# Patient Record
Sex: Female | Born: 1955 | Race: White | Hispanic: No | Marital: Married | Smoking: Former smoker
Health system: Southern US, Community
[De-identification: ages and names within clinical notes are randomized; demographics above are authoritative.]

## PROBLEM LIST (undated history)

## (undated) DIAGNOSIS — R002 Palpitations: Secondary | ICD-10-CM

## (undated) DIAGNOSIS — R519 Headache, unspecified: Secondary | ICD-10-CM

## (undated) DIAGNOSIS — R87619 Unspecified abnormal cytological findings in specimens from cervix uteri: Secondary | ICD-10-CM

## (undated) DIAGNOSIS — F329 Major depressive disorder, single episode, unspecified: Secondary | ICD-10-CM

## (undated) DIAGNOSIS — K635 Polyp of colon: Secondary | ICD-10-CM

## (undated) DIAGNOSIS — E785 Hyperlipidemia, unspecified: Secondary | ICD-10-CM

## (undated) DIAGNOSIS — K760 Fatty (change of) liver, not elsewhere classified: Secondary | ICD-10-CM

## (undated) DIAGNOSIS — F32A Depression, unspecified: Secondary | ICD-10-CM

## (undated) DIAGNOSIS — R51 Headache: Secondary | ICD-10-CM

## (undated) DIAGNOSIS — I10 Essential (primary) hypertension: Secondary | ICD-10-CM

## (undated) DIAGNOSIS — G43909 Migraine, unspecified, not intractable, without status migrainosus: Secondary | ICD-10-CM

## (undated) DIAGNOSIS — K921 Melena: Secondary | ICD-10-CM

## (undated) HISTORY — DX: Headache: R51

## (undated) HISTORY — DX: Migraine, unspecified, not intractable, without status migrainosus: G43.909

## (undated) HISTORY — DX: Depression, unspecified: F32.A

## (undated) HISTORY — DX: Polyp of colon: K63.5

## (undated) HISTORY — DX: Essential (primary) hypertension: I10

## (undated) HISTORY — PX: LYMPH NODE BIOPSY: SHX201

## (undated) HISTORY — PX: APPENDECTOMY: SHX54

## (undated) HISTORY — DX: Hyperlipidemia, unspecified: E78.5

## (undated) HISTORY — DX: Palpitations: R00.2

## (undated) HISTORY — DX: Unspecified abnormal cytological findings in specimens from cervix uteri: R87.619

## (undated) HISTORY — DX: Headache, unspecified: R51.9

## (undated) HISTORY — PX: TONSILLECTOMY: SUR1361

## (undated) HISTORY — DX: Fatty (change of) liver, not elsewhere classified: K76.0

## (undated) HISTORY — PX: LUMBAR DISC ARTHROPLASTY: SHX699

## (undated) HISTORY — DX: Major depressive disorder, single episode, unspecified: F32.9

## (undated) HISTORY — DX: Melena: K92.1

---

## 2013-06-18 ENCOUNTER — Encounter: Payer: Self-pay | Admitting: Family Medicine

## 2013-06-18 ENCOUNTER — Ambulatory Visit (INDEPENDENT_AMBULATORY_CARE_PROVIDER_SITE_OTHER): Payer: Commercial Managed Care - PPO | Admitting: Family Medicine

## 2013-06-18 VITALS — BP 120/82 | HR 86 | Temp 99.0°F | Ht 64.25 in | Wt 156.0 lb

## 2013-06-18 DIAGNOSIS — I10 Essential (primary) hypertension: Secondary | ICD-10-CM

## 2013-06-18 DIAGNOSIS — G43909 Migraine, unspecified, not intractable, without status migrainosus: Secondary | ICD-10-CM

## 2013-06-18 DIAGNOSIS — F3289 Other specified depressive episodes: Secondary | ICD-10-CM

## 2013-06-18 DIAGNOSIS — R7309 Other abnormal glucose: Secondary | ICD-10-CM

## 2013-06-18 DIAGNOSIS — F32A Depression, unspecified: Secondary | ICD-10-CM | POA: Insufficient documentation

## 2013-06-18 DIAGNOSIS — F329 Major depressive disorder, single episode, unspecified: Secondary | ICD-10-CM

## 2013-06-18 DIAGNOSIS — E785 Hyperlipidemia, unspecified: Secondary | ICD-10-CM | POA: Insufficient documentation

## 2013-06-18 DIAGNOSIS — R7303 Prediabetes: Secondary | ICD-10-CM

## 2013-06-18 DIAGNOSIS — R002 Palpitations: Secondary | ICD-10-CM

## 2013-06-18 DIAGNOSIS — G8929 Other chronic pain: Secondary | ICD-10-CM

## 2013-06-18 DIAGNOSIS — M542 Cervicalgia: Secondary | ICD-10-CM

## 2013-06-18 HISTORY — DX: Palpitations: R00.2

## 2013-06-18 LAB — LIPID PANEL
CHOL/HDL RATIO: 4
Cholesterol: 209 mg/dL — ABNORMAL HIGH (ref 0–200)
HDL: 56 mg/dL (ref >39.00)
LDL CALC: 112 mg/dL — AB (ref 0–99)
NonHDL: 153
Triglycerides: 203 mg/dL — ABNORMAL HIGH (ref 0.0–149.0)
VLDL: 40.6 mg/dL — AB (ref 0.0–40.0)

## 2013-06-18 LAB — HEMOGLOBIN A1C: Hgb A1c MFr Bld: 5.6 % (ref 4.6–6.5)

## 2013-06-18 MED ORDER — SUMATRIPTAN SUCCINATE 50 MG PO TABS: 50.0000 mg | ORAL_TABLET | ORAL | Status: DC | PRN

## 2013-06-18 MED ORDER — METOPROLOL SUCCINATE ER 50 MG PO TB24: 50.0000 mg | ORAL_TABLET | Freq: Every day | ORAL | Status: DC

## 2013-06-18 MED ORDER — VENLAFAXINE HCL 37.5 MG PO TABS: 37.5000 mg | ORAL_TABLET | Freq: Every day | ORAL | Status: DC

## 2013-06-18 NOTE — Progress Notes (Signed)
No chief complaint on file.   HPI:  Emily ReamsDeborah Fowler is here to establish care. Recently moved here. Last PCP and physical: has been 1.5 years since last physical - will see gyn.   Has the following chronic problems and concerns today:  Patient Active Problem List   Diagnosis Date Noted  . Depression 06/18/2013  . Palpitations - evaluated by cardiologist in 2015 06/18/2013  . Hypertension 06/18/2013  . Dyslipidemia 06/18/2013  . Prediabetes 06/18/2013  . Chronic neck pain - DDD 06/18/2013  . Migraines 06/18/2013   ? Borderline diabetes: -has been about two years since check -denies: polyuria, polydipsia or vision changes  ? Borderline HLD: -in the past, never on medication -hx of NASH - saw GI in the past for this and no medication -diet is good, walking 3 times per week  HTN/Palpitations: -on metoprolol for a long time -reports on BB because of palpitations too - evaluated by cardiologist in 2015 -denies: CP, SOB, palpitation, swelling  Depression: -on effexor chronically  -stable -no recent depression or thoughts of self harm  DDD: -saw her neurosurgeon recently and they reports MRI was ok and advised PT  Migraines: -uses 1/2 or small amount of imitrex a few times per month and aware of risks  ROS: See pertinent positives and negatives per HPI.  Past Medical History  Diagnosis Date  . Hypertension   . Hyperlipidemia   . Blood in stool     evaluated with GI in 2014 with EGD and colonoscopy and had hemorroids, told repeat colonoscoy in 10 years  . Frequent headaches   . Migraines   . Colon polyps   . Palpitations - evaluated by cardiologist in 2015 06/18/2013   Past Surgical History  Procedure Laterality Date  . Lumbar disc arthroplasty    . Appendectomy    . Tonsillectomy    . Lymph node biopsy      in 2015, evaluated by ENT for abnormal lymph node in neck     Family History  Problem Relation Age of Onset  . Heart disease Father   . Hyperlipidemia  Father   . Hypertension Father   . Diabetes Mother   . Hyperlipidemia Mother   . Hypertension Mother   . Mental illness Brother   . Cancer Maternal Aunt     lymphoma  . Cancer Maternal Grandfather     prostate  . Diabetes Paternal Grandmother     History   Social History  . Marital Status: Divorced    Spouse Name: N/A    Number of Children: N/A  . Years of Education: N/A   Social History Main Topics  . Smoking status: Former Games developermoker  . Smokeless tobacco: None     Comment: 1-2 cigs for 10 years, quit in 2014  . Alcohol Use: Yes     Comment: a few drinks per week  . Drug Use: None  . Sexual Activity: None   Other Topics Concern  . 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, Museum/gallery curatorchemistry and engineering      Spiritual Beliefs: spiritual person, does not attend church, raised catholic      Lifestyle: walking 3 days per week; diet is healthy             Current outpatient prescriptions:metoprolol succinate (TOPROL-XL) 50 MG 24 hr tablet,  Take 1 tablet (50 mg total) by mouth daily., Disp: 90 tablet, Rfl: 3;  Multiple Vitamins-Minerals (MULTIVITAMIN PO), Take by mouth., Disp: , Rfl: ;  NON FORMULARY, Tumeric, Disp: , Rfl: ;  PRESCRIPTION MEDICATION, Minivey 1mg /0.5-28 day, Disp: , Rfl: ;  Probiotic Product (ALIGN PO), Take by mouth daily., Disp: , Rfl:  SUMAtriptan (IMITREX) 50 MG tablet, Take 1 tablet (50 mg total) by mouth every 2 (two) hours as needed for migraine or headache. May repeat in 2 hours if headache persists or recurs., Disp: 10 tablet, Rfl: 3;  venlafaxine (EFFEXOR) 37.5 MG tablet, Take 1 tablet (37.5 mg total) by mouth daily., Disp: 90 tablet, Rfl: 3  EXAM:  Filed Vitals:   06/18/13 1121  BP: 120/82  Pulse: 86  Temp: 99 F (37.2 C)    Body mass index is 26.57 kg/(m^2).  GENERAL: vitals reviewed and listed above, alert,  oriented, appears well hydrated and in no acute distress  HEENT: atraumatic, conjunttiva clear, no obvious abnormalities on inspection of external nose and ears  NECK: no obvious masses on inspection  LUNGS: clear to auscultation bilaterally, no wheezes, rales or rhonchi, good air movement  CV: HRRR, no peripheral edema  MS: moves all extremities without noticeable abnormality  PSYCH: pleasant and cooperative, no obvious depression or anxiety  ASSESSMENT AND PLAN:  Discussed the following assessment and plan:  Depression - Plan: venlafaxine (EFFEXOR) 37.5 MG tablet  Palpitations - Plan: metoprolol succinate (TOPROL-XL) 50 MG 24 hr tablet  Hypertension - Plan: metoprolol succinate (TOPROL-XL) 50 MG 24 hr tablet  Dyslipidemia - Plan: Lipid panel  Prediabetes - Plan: Hemoglobin A1c  Migraines - Plan: SUMAtriptan (IMITREX) 50 MG tablet  Chronic neck pain - DDD  -We reviewed the PMH, PSH, FH, SH, Meds and Allergies. -We provided refills for any medications we will prescribe as needed. -We addressed current concerns per orders and patient instructions. -We have asked for records for pertinent exams, studies, vaccines and notes from previous providers. -We have advised patient to follow up per instructions below.   -Patient advised to return or notify a doctor immediately if symptoms worsen or persist or new concerns arise.  Patient Instructions  -We have ordered labs or studies at this visit. It can take up to 1-2 weeks for results and processing. We will contact you with instructions IF your results are abnormal. Normal results will be released to your [facility]. If you have not heard from us or can not find your results in [facility] in 2 weeks please contact our office.  -PLEASE SIGN UP FOR MYCHART TODAY   We recommend the following healthy lifestyle measures: - eat a healthy diet consisting of lots of vegetables, fruits, beans, nuts, seeds, healthy meats such as white chicken  and fish and whole grains.  - avoid fried foods, fast food, processed foods, sodas, red meet and other fattening foods.  - get a least 150 minutes of aerobic exercise per week.   -let us know if you want to do the physical therapy for you neck and we can place preferral  -call to schedule your appointment with a gynecologist - number in brochure provied  Follow up in: 4-6 months      Durward Matranga R.

## 2013-06-18 NOTE — Progress Notes (Signed)
Pre visit review using our clinic review tool, if applicable. No additional management support is needed unless otherwise documented below in the visit note. 

## 2013-06-18 NOTE — Patient Instructions (Signed)
-  We have ordered labs or studies at this visit. It can take up to 1-2 weeks for results and processing. We will contact you with instructions IF your results are abnormal. Normal results will be released to your [facility]. If you have not heard from us or can not find your results in [facility] in 2 weeks please contact our office.  -PLEASE SIGN UP FOR MYCHART TODAY   We recommend the following healthy lifestyle measures: - eat a healthy diet consisting of lots of vegetables, fruits, beans, nuts, seeds, healthy meats such as white chicken and fish and whole grains.  - avoid fried foods, fast food, processed foods, sodas, red meet and other fattening foods.  - get a least 150 minutes of aerobic exercise per week.   -let us know if you want to do the physical therapy for you neck and we can place preferral  -call to schedule your appointment with a gynecologist - number in brochure provied  Follow up in: 4-6 months

## 2013-06-19 ENCOUNTER — Telehealth: Payer: Self-pay | Admitting: Family Medicine

## 2013-06-19 NOTE — Telephone Encounter (Signed)
Relevant patient education assigned to patient using Emmi. ° °

## 2013-07-21 ENCOUNTER — Telehealth: Payer: Self-pay | Admitting: Family Medicine

## 2013-07-21 DIAGNOSIS — M545 Low back pain, unspecified: Secondary | ICD-10-CM

## 2013-07-21 NOTE — Telephone Encounter (Signed)
I called the pt and left a detailed message on the pts cell number the order was placed for the referral and someone from our office will call her with the appt.

## 2013-07-21 NOTE — Telephone Encounter (Signed)
Pt was to call back w/ the name of the PT person she wanted to see for her back issue. It is  Garment[facility]textile [facility]Brad [facility]

## 2013-07-23 ENCOUNTER — Ambulatory Visit (INDEPENDENT_AMBULATORY_CARE_PROVIDER_SITE_OTHER): Payer: Commercial Managed Care - PPO | Admitting: Family Medicine

## 2013-07-23 ENCOUNTER — Encounter: Payer: Self-pay | Admitting: Family Medicine

## 2013-07-23 VITALS — BP 108/70 | HR 80 | Temp 98.7°F | Ht 64.25 in

## 2013-07-23 DIAGNOSIS — M5136 Other intervertebral disc degeneration, lumbar region: Secondary | ICD-10-CM | POA: Insufficient documentation

## 2013-07-23 DIAGNOSIS — M545 Low back pain, unspecified: Secondary | ICD-10-CM

## 2013-07-23 DIAGNOSIS — M542 Cervicalgia: Secondary | ICD-10-CM

## 2013-07-23 DIAGNOSIS — G8929 Other chronic pain: Secondary | ICD-10-CM

## 2013-07-23 DIAGNOSIS — M5137 Other intervertebral disc degeneration, lumbosacral region: Secondary | ICD-10-CM

## 2013-07-23 MED ORDER — CYCLOBENZAPRINE HCL 10 MG PO TABS: 10.0000 mg | ORAL_TABLET | Freq: Three times a day (TID) | ORAL | Status: DC | PRN

## 2013-07-23 NOTE — Progress Notes (Signed)
No chief complaint on file.   HPI:  Back Pain: -hx ddd and siatica with hx remote discectomy, has had injections, [facility] -flares from time to time and had flare in last 4 days, now improving, after driving a lot -pain is intermittent, severe with certain twisting and bending movement, better with lying down -has PT set up -denies: fevers, malaise, weakness, numbness, bowel or bladder incontinence - sometimes feels like L leg will give out  MRI from 2012 and spine center notes scanned in.  ROS: See pertinent positives and negatives per HPI.  Past Medical History  Diagnosis Date  . Hypertension   . Hyperlipidemia   . Blood in stool     evaluated with GI in 2014 with EGD and colonoscopy and had hemorroids, told repeat colonoscoy in 10 years  . Frequent headaches   . Migraines   . Colon polyps   . Palpitations - evaluated by cardiologist in 2015 06/18/2013  . Abnormal Pap smear of cervix     Past Surgical History  Procedure Laterality Date  . Lumbar disc arthroplasty    . Appendectomy    . Tonsillectomy    . Lymph node biopsy      in 2015, evaluated by ENT for abnormal lymph node in neck    Family History  Problem Relation Age of Onset  . Heart disease Father   . Hyperlipidemia Father   . Hypertension Father   . Diabetes Mother   . Hyperlipidemia Mother   . Hypertension Mother   . Mental illness Brother   . Cancer Maternal Aunt     lymphoma  . Cancer Maternal Grandfather     prostate  . Diabetes Paternal Grandmother     History   Social History  . Marital Status: Divorced    Spouse Name: N/A    Number of Children: N/A  . Years of Education: N/A   Social History Main Topics  . Smoking status: Former Games developermoker  . Smokeless tobacco: None     Comment: 1-2 cigs for 10 years, quit in 2014  . Alcohol Use: Yes     Comment: a few drinks per week  . Drug Use: None  . Sexual Activity: None   Other Topics Concern  . 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, Museum/gallery curatorchemistry and engineering      Spiritual Beliefs: spiritual person, does not attend church, raised catholic      Lifestyle: walking 3 days per week; diet is healthy             Current outpatient prescriptions:metoprolol succinate (TOPROL-XL) 50 MG 24 hr tablet, Take 1 tablet (50 mg total) by mouth daily., Disp: 90 tablet, Rfl: 3;  Multiple Vitamins-Minerals (MULTIVITAMIN PO), Take by mouth., Disp: , Rfl: ;  NON FORMULARY, Tumeric, Disp: , Rfl: ;  PRESCRIPTION MEDICATION, Minivey 1mg /0.5-28 day, Disp: , Rfl: ;  Probiotic Product (ALIGN PO), Take by mouth daily., Disp: , Rfl:  SUMAtriptan (IMITREX) 50 MG tablet, Take 1 tablet (50 mg total) by mouth every 2 (two) hours as needed for migraine or headache. May repeat in 2 hours if headache persists or recurs., Disp: 10 tablet, Rfl: 3;  venlafaxine (EFFEXOR) 37.5 MG tablet, Take 1 tablet (37.5 mg total) by mouth daily., Disp: 90 tablet, Rfl: 3 cyclobenzaprine (FLEXERIL)  10 MG tablet, Take 1 tablet (10 mg total) by mouth 3 (three) times daily as needed for muscle spasms., Disp: 15 tablet, Rfl: 0  EXAM:  Filed Vitals:   07/23/13 1018  BP: 108/70  Pulse: 80  Temp: 98.7 F (37.1 C)    Body mass index is 0.00 kg/(m^2).  GENERAL: vitals reviewed and listed above, alert, oriented, appears well hydrated and in no acute distress  HEENT: atraumatic, conjunttiva clear, no obvious abnormalities on inspection of external nose and ears  NECK: no obvious masses on inspection  Normal Gait Normal inspection of back, no obvious scoliosis or leg length descrepancy No bony TTP Soft tissue TTP at: very low lumbar paraspinal soft tissues bilat and L PSIS/SI jt region -/+ tests: neg trendelenburg,+facet loading L, -SLRT, -CLRT, +FABER bilat, +FADIR + Normal muscle strength, sensation to light  touch and DTRs in LEs bilaterally  MS: moves all extremities without noticeable abnormality  PSYCH: pleasant and cooperative, no obvious depression or anxiety  ASSESSMENT AND PLAN:  Discussed the following assessment and plan:  Chronic neck pain - DDD  DDD (degenerative disc disease), lumbar - Plan: cyclobenzaprine (FLEXERIL) 10 MG tablet  Left low back pain, with sciatica presence unspecified - Plan: cyclobenzaprine (FLEXERIL) 10 MG tablet  DDD (degenerative disc disease), lumbar, s/p surgery remotely - followed by [facility]  -we discussed possible serious and likely etiologies, workup and treatment, treatment risks and return precautions with SI jy pathology versus DDD with mild radicular symptoms most likely, no alarm features or findings on exam -after this discussion, Preciosa opted for follow up with her spine center as steroid injs helped emensly in the past and she does not like taking oral steroids.discussed options for pain and she prefers nsaids or tylenol and muscle relaxer after discussion risks. HEP -follow up advised with spine cent and PT and here as needed -of course, we advised Abbygail  to return or notify a doctor immediately if symptoms worsen or persist or new concerns arise. -spine center results scanned in  -Patient advised to return or notify a doctor immediately if symptoms worsen or persist or new concerns arise.  Patient Instructions  Call for appointment with your back specialist: [phone]  Do the exercises provided as tolerated and keep the physical therapy appointment  Tylenol 500-1000mg  up to 3 times dialy or aleve up to 2 tablets twice dialy  Muscle relaxer as needed per instructions  Heat 15 minutes twice dialy  Follow up with me as needed      Kriste Basque R.

## 2013-07-23 NOTE — Progress Notes (Signed)
Pre visit review using our clinic review tool, if applicable. No additional management support is needed unless otherwise documented below in the visit note. 

## 2013-07-23 NOTE — Patient Instructions (Signed)
Call for appointment with your back specialist: [phone]  Do the exercises provided as tolerated and keep the physical therapy appointment  Tylenol 500-1000mg  up to 3 times dialy or aleve up to 2 tablets twice dialy  Muscle relaxer as needed per instructions  Heat 15 minutes twice dialy  Follow up with me as needed

## 2013-08-23 ENCOUNTER — Other Ambulatory Visit: Payer: Self-pay | Admitting: Family Medicine

## 2013-08-23 MED ORDER — VENLAFAXINE HCL 37.5 MG PO TABS: 37.5000 mg | ORAL_TABLET | Freq: Every day | ORAL | Status: DC

## 2013-08-23 NOTE — Addendum Note (Signed)
Addended by: Johnella MoloneyFUNDERBURK, JO A on: 08/23/2013 05:31 PM   Modules accepted: Orders

## 2013-08-23 NOTE — Telephone Encounter (Signed)
Rx done. 

## 2013-10-03 ENCOUNTER — Other Ambulatory Visit: Payer: Self-pay | Admitting: Family Medicine

## 2013-10-25 ENCOUNTER — Encounter: Payer: Self-pay | Admitting: Family Medicine

## 2013-10-25 ENCOUNTER — Ambulatory Visit (INDEPENDENT_AMBULATORY_CARE_PROVIDER_SITE_OTHER): Payer: Commercial Managed Care - PPO | Admitting: Family Medicine

## 2013-10-25 VITALS — BP 100/80 | HR 96 | Temp 98.8°F | Ht 64.25 in | Wt 160.0 lb

## 2013-10-25 DIAGNOSIS — J069 Acute upper respiratory infection, unspecified: Secondary | ICD-10-CM

## 2013-10-25 NOTE — Progress Notes (Signed)
No chief complaint on file.   HPI:  -started: about 8 days ago  -symptoms:nasal congestion, sore throat, cough, fatigue, PND, cough, did feel a little achey, very mild SOB -denies:fever, SOB, NVD, tooth pain -has tried:  Robitussin, cough medication, musinex -sick contacts/travel/risks: denies flu exposure, tick exposure or or Ebola risks -she is going to get flu shot tomorrow  ROS: See pertinent positives and negatives per HPI.  Past Medical History  Diagnosis Date  . Hypertension   . Hyperlipidemia   . Blood in stool     evaluated with GI in 2014 with EGD and colonoscopy and had hemorroids, told repeat colonoscoy in 10 years  . Frequent headaches   . Migraines   . Colon polyps   . Palpitations - evaluated by cardiologist in 2015 06/18/2013  . Abnormal Pap smear of cervix     Past Surgical History  Procedure Laterality Date  . Lumbar disc arthroplasty    . Appendectomy    . Tonsillectomy    . Lymph node biopsy      in 2015, evaluated by ENT for abnormal lymph node in neck    Family History  Problem Relation Age of Onset  . Heart disease Father   . Hyperlipidemia Father   . Hypertension Father   . Diabetes Mother   . Hyperlipidemia Mother   . Hypertension Mother   . Mental illness Brother   . Cancer Maternal Aunt     lymphoma  . Cancer Maternal Grandfather     prostate  . Diabetes Paternal Grandmother     History   Social History  . Marital Status: Divorced    Spouse Name: N/A    Number of Children: N/A  . Years of Education: N/A   Social History Main Topics  . Smoking status: Former Games developermoker  . Smokeless tobacco: None     Comment: 1-2 cigs for 10 years, quit in 2014  . Alcohol Use: Yes     Comment: a few drinks per week  . Drug Use: None  . Sexual Activity: None   Other Topics Concern  . None   Social History Narrative   Work or School: [employer] - parttime      Home Situation: living with partner - female partner; three great kids -  all out of house law, Museum/gallery curatorchemistry and engineering      Spiritual Beliefs: spiritual person, does not attend church, raised catholic      Lifestyle: walking 3 days per week; diet is healthy             Current outpatient prescriptions:metoprolol succinate (TOPROL-XL) 50 MG 24 hr tablet, Take 1 tablet (50 mg total) by mouth daily., Disp: 90 tablet, Rfl: 3;  Multiple Vitamins-Minerals (MULTIVITAMIN PO), Take by mouth., Disp: , Rfl: ;  NON FORMULARY, Tumeric, Disp: , Rfl: ;  PRESCRIPTION MEDICATION, Minivey 1mg /0.5-28 day, Disp: , Rfl: ;  Probiotic Product (ALIGN PO), Take by mouth daily., Disp: , Rfl:  SUMAtriptan (IMITREX) 100 MG tablet, TAKE 1 TABLET BY MOUTH EVERY DAY AS NEEDED FOR MIGRAINE HEADACHE, Disp: 20 tablet, Rfl: 3;  venlafaxine (EFFEXOR) 37.5 MG tablet, Take 1 tablet (37.5 mg total) by mouth daily., Disp: 90 tablet, Rfl: 1  EXAM:  Filed Vitals:   10/25/13 1107  BP: 100/80  Pulse: 96  Temp: 98.8 F (37.1 C)    Body mass index is 27.25 kg/(m^2).  GENERAL: vitals reviewed and listed above, alert, oriented, appears well hydrated and in no acute distress  HEENT: atraumatic, conjunttiva clear, no obvious abnormalities on inspection of external nose and ears, normal appearance of ear canals and TMs, clear nasal congestion, mild post oropharyngeal erythema with PND, no tonsillar edema or exudate, no sinus TTP  NECK: no obvious masses on inspection  LUNGS: clear to auscultation bilaterally, no wheezes, rales or rhonchi, good air movement  CV: HRRR, no peripheral edema  MS: moves all extremities without noticeable abnormality  PSYCH: pleasant and cooperative, no obvious depression or anxiety  ASSESSMENT AND PLAN:  Discussed the following assessment and plan:  Acute upper respiratory infection  -given HPI and exam findings today, a serious infection or illness is unlikely. We discussed potential etiologies, with VURI being most likely, and advised supportive care and  monitoring. We discussed treatment side effects, likely course, antibiotic misuse, transmission, and signs of developing a serious illness. -of course, we advised to return or notify a doctor immediately if symptoms worsen or persist or new concerns arise.    Patient Instructions  INSTRUCTIONS FOR UPPER RESPIRATORY INFECTION:  -plenty of rest and fluids  -nasal saline wash 2-3 times daily (use prepackaged nasal saline or bottled/distilled water if making your own)   -can use sinex nasal spray for drainage and nasal congestion - but do NOT use longer then 3-4 days  -can use tylenol or ibuprofen as directed for aches and sorethroat  -in the winter time, using a humidifier at night is helpful (please follow cleaning instructions)  -if you are taking a cough medication - use only as directed, may also try a teaspoon of honey to coat the throat and throat lozenges  -for sore throat, salt water gargles can help  -follow up if you have fevers, facial pain, tooth pain, difficulty breathing or are worsening or not getting better in 5-7 days      KIM, HANNAH R.

## 2013-10-25 NOTE — Patient Instructions (Signed)

## 2013-10-25 NOTE — Progress Notes (Signed)
Pre visit review using our clinic review tool, if applicable. No additional management support is needed unless otherwise documented below in the visit note. 

## 2013-11-08 ENCOUNTER — Encounter: Payer: Self-pay | Admitting: Family Medicine

## 2014-01-10 ENCOUNTER — Ambulatory Visit (INDEPENDENT_AMBULATORY_CARE_PROVIDER_SITE_OTHER): Payer: Commercial Managed Care - PPO | Admitting: Family Medicine

## 2014-01-10 ENCOUNTER — Encounter: Payer: Self-pay | Admitting: Family Medicine

## 2014-01-10 VITALS — BP 128/90 | HR 88 | Temp 98.5°F | Ht 63.75 in | Wt 159.0 lb

## 2014-01-10 DIAGNOSIS — I159 Secondary hypertension, unspecified: Secondary | ICD-10-CM

## 2014-01-10 DIAGNOSIS — R7309 Other abnormal glucose: Secondary | ICD-10-CM

## 2014-01-10 DIAGNOSIS — F329 Major depressive disorder, single episode, unspecified: Secondary | ICD-10-CM

## 2014-01-10 DIAGNOSIS — H6992 Unspecified Eustachian tube disorder, left ear: Secondary | ICD-10-CM

## 2014-01-10 DIAGNOSIS — R7303 Prediabetes: Secondary | ICD-10-CM

## 2014-01-10 DIAGNOSIS — R42 Dizziness and giddiness: Secondary | ICD-10-CM

## 2014-01-10 DIAGNOSIS — F32A Depression, unspecified: Secondary | ICD-10-CM

## 2014-01-10 LAB — CBC WITH DIFFERENTIAL/PLATELET
Basophils Absolute: 0 10*3/uL (ref 0.0–0.1)
Basophils Relative: 0.4 % (ref 0.0–3.0)
Eosinophils Absolute: 0.2 10*3/uL (ref 0.0–0.7)
Eosinophils Relative: 3 % (ref 0.0–5.0)
HCT: 45.5 % (ref 36.0–46.0)
Hemoglobin: 15.1 g/dL — ABNORMAL HIGH (ref 12.0–15.0)
LYMPHS ABS: 2.3 10*3/uL (ref 0.7–4.0)
Lymphocytes Relative: 29.6 % (ref 12.0–46.0)
MCHC: 33.2 g/dL (ref 30.0–36.0)
MCV: 91.1 fl (ref 78.0–100.0)
MONOS PCT: 13.6 % — AB (ref 3.0–12.0)
Monocytes Absolute: 1 10*3/uL (ref 0.1–1.0)
NEUTROS ABS: 4.1 10*3/uL (ref 1.4–7.7)
Neutrophils Relative %: 53.4 % (ref 43.0–77.0)
Platelets: 257 10*3/uL (ref 150.0–400.0)
RBC: 4.99 Mil/uL (ref 3.87–5.11)
RDW: 12.9 % (ref 11.5–15.5)
WBC: 7.7 10*3/uL (ref 4.0–10.5)

## 2014-01-10 LAB — BASIC METABOLIC PANEL
BUN: 15 mg/dL (ref 6–23)
CHLORIDE: 105 meq/L (ref 96–112)
CO2: 28 mEq/L (ref 19–32)
Calcium: 9.4 mg/dL (ref 8.4–10.5)
Creatinine, Ser: 0.7 mg/dL (ref 0.4–1.2)
GFR: 87.26 mL/min (ref >60.00)
Glucose, Bld: 89 mg/dL (ref 70–99)
Potassium: 4.2 mEq/L (ref 3.5–5.1)
SODIUM: 140 meq/L (ref 135–145)

## 2014-01-10 LAB — TSH: TSH: 1.32 u[IU]/mL (ref 0.35–4.50)

## 2014-01-10 LAB — HEMOGLOBIN A1C: HEMOGLOBIN A1C: 5.8 % (ref 4.6–6.5)

## 2014-01-10 MED ORDER — LISINOPRIL 10 MG PO TABS: 10.0000 mg | ORAL_TABLET | Freq: Every day | ORAL | Status: DC

## 2014-01-10 NOTE — Progress Notes (Signed)
Pre visit review using our clinic review tool, if applicable. No additional management support is needed unless otherwise documented below in the visit note. 

## 2014-01-10 NOTE — Patient Instructions (Addendum)
BEFORE YOU LEAVE: -labs -schedule follow up in 1 month  Stop the metoprolol  Start the lisinopril  Schedule mammogram  Bring Tdap or tetanus vaccine record to next visit  flonase daily

## 2014-01-10 NOTE — Progress Notes (Signed)
HPI:  Acute visit for:  Vertigo: -balance issues her whole life since she was a child with mild vertigo and dizziness, feels mildly off balance briefly  -usually with a movement or sudden standing -denies: worsening headaches, nausea, vomiting, vision changes, polyuria, polydipsia, weakness, numbness -hx prediabetes, palpitations, migraines and depression -she wants to check an a1c and thyroid due to family hx -doing boot camp and no symptoms with this -she also wants to try decreasing or stopping the BB - took for palpitaitons in the past  ROS: See pertinent positives and negatives per HPI.  Past Medical History  Diagnosis Date  . Hypertension   . Hyperlipidemia   . Blood in stool     evaluated with GI in 2014 with EGD and colonoscopy and had hemorroids, told repeat colonoscoy in 10 years  . Frequent headaches   . Migraines   . Colon polyps   . Palpitations - evaluated by cardiologist in 2015 06/18/2013  . Abnormal Pap smear of cervix     Past Surgical History  Procedure Laterality Date  . Lumbar disc arthroplasty    . Appendectomy    . Tonsillectomy    . Lymph node biopsy      in 2015, evaluated by ENT for abnormal lymph node in neck    Family History  Problem Relation Age of Onset  . Heart disease Father   . Hyperlipidemia Father   . Hypertension Father   . Diabetes Mother   . Hyperlipidemia Mother   . Hypertension Mother   . Mental illness Brother   . Cancer Maternal Aunt     lymphoma  . Cancer Maternal Grandfather     prostate  . Diabetes Paternal Grandmother     History   Social History  . Marital Status: Divorced    Spouse Name: N/A    Number of Children: N/A  . Years of Education: N/A   Social History Main Topics  . Smoking status: Former Games developer  . Smokeless tobacco: None     Comment: 1-2 cigs for 10 years, quit in 2014  . Alcohol Use: Yes     Comment: a few drinks per week  . Drug Use: None  . Sexual Activity: None   Other Topics Concern   . 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, Museum/gallery curator      Spiritual Beliefs: spiritual person, does not attend church, raised catholic      Lifestyle: walking 3 days per week; diet is healthy             Current outpatient prescriptions: Multiple Vitamins-Minerals (MULTIVITAMIN PO), Take by mouth., Disp: , Rfl: ;  NON FORMULARY, Tumeric, Disp: , Rfl: ;  PRESCRIPTION MEDICATION, Minivey /0.5-28 day, Disp: , Rfl: ;  Probiotic Product (ALIGN PO), Take by mouth daily., Disp: , Rfl: ;  SUMAtriptan (IMITREX) 100 MG tablet, TAKE 1 TABLET BY MOUTH EVERY DAY AS NEEDED FOR MIGRAINE HEADACHE, Disp: 20 tablet, Rfl: 3 venlafaxine (EFFEXOR) 37.5 MG tablet, Take 1 tablet (37.5 mg total) by mouth daily., Disp: 90 tablet, Rfl: 1;  lisinopril (PRINIVIL,ZESTRIL) 10 MG tablet, Take 1 tablet (10 mg total) by mouth daily., Disp: 30 tablet, Rfl: 3  EXAM:  Filed Vitals:   01/10/14 1337  BP: 128/90  Pulse: 88  Temp: 98.5 F (36.9 C)  Body mass index is 27.52 kg/(m^2).  GENERAL: vitals reviewed and listed above, alert, oriented, appears well hydrated and in no acute distress  HEENT: atraumatic, conjunttiva clear, PERRLA, no obvious abnormalities on inspection of external nose and ears  NECK: no obvious masses on inspection  LUNGS: clear to auscultation bilaterally, no wheezes, rales or rhonchi, good air movement  CV: HRRR, no peripheral edema  MS: moves all extremities without noticeable abnormality  PSYCH: pleasant and cooperative, no obvious depression or anxiety  NEURO: CN II-XII grossly intact; finger to nose normal  ASSESSMENT AND PLAN:  Discussed the following assessment and plan:  Dizziness and giddiness - Plan: CBC with Differential, Basic metabolic panel, TSH  Secondary hypertension, unspecified - Plan: lisinopril  (PRINIVIL,ZESTRIL) 10 MG tablet  Depression - Plan: TSH  Prediabetes - Plan: Hemoglobin A1c  -we discussed possible serious and likely etiologies, workup and treatment, treatment risks and return precautions -after this discussion, Danalee opted for labs per above, stop BB and try lisinopril in place after risks and benefits discussed -follow up advised in 1 month -of course, we advised Jonice  to return or notify a doctor immediately if symptoms worsen or persist or new concerns arise.  .  -Patient advised to return or notify a doctor immediately if symptoms worsen or persist or new concerns arise.  Patient Instructions  BEFORE YOU LEAVE: -labs -schedule follow up in 1 month  Stop the metoprolol  Start the lisinopril  Schedule mammogram  Bring Tdap or tetanus vaccine record to next visit      Milo Solana R.

## 2014-02-07 ENCOUNTER — Ambulatory Visit (INDEPENDENT_AMBULATORY_CARE_PROVIDER_SITE_OTHER): Payer: Commercial Managed Care - PPO | Admitting: Family Medicine

## 2014-02-07 ENCOUNTER — Encounter: Payer: Self-pay | Admitting: Family Medicine

## 2014-02-07 VITALS — BP 122/84 | HR 88 | Temp 98.8°F | Ht 63.75 in | Wt 157.9 lb

## 2014-02-07 DIAGNOSIS — E785 Hyperlipidemia, unspecified: Secondary | ICD-10-CM

## 2014-02-07 DIAGNOSIS — I159 Secondary hypertension, unspecified: Secondary | ICD-10-CM

## 2014-02-07 DIAGNOSIS — G43809 Other migraine, not intractable, without status migrainosus: Secondary | ICD-10-CM

## 2014-02-07 DIAGNOSIS — R42 Dizziness and giddiness: Secondary | ICD-10-CM

## 2014-02-07 NOTE — Patient Instructions (Signed)
BEFORE YOUR LEAVE: -schedule your physical in about 3 months, come fasting  We recommend the following healthy lifestyle measures: - eat a healthy diet consisting of lots of vegetables, fruits, beans, nuts, seeds, healthy meats such as white chicken and fish and whole grains.  - avoid fried foods, fast food, processed foods, sodas, red meet and other fattening foods.  - get a least 150 minutes of aerobic exercise per week.

## 2014-02-07 NOTE — Progress Notes (Signed)
HPI:  Vertigo: -labs were ok, she wanted to trial off BB -reports flonase and off BB has miraculously resolved her vertigo  HTN/Hx palpitations: -wanted to do trial of BB -changed to lisinopril 01/10/14 -reports: she has increased energy and exercise tolerance and feels much better off the BB - mild occ palpitations but very tolerable -denies: CP, SOB, DOE  Hx Depression: -on effexor -stable  Hx Migraines: -uses triptan prn -stable  ROS: See pertinent positives and negatives per HPI.  Past Medical History  Diagnosis Date  . Hypertension   . Hyperlipidemia   . Blood in stool     evaluated with GI in 2014 with EGD and colonoscopy and had hemorroids, told repeat colonoscoy in 10 years  . Frequent headaches   . Migraines   . Colon polyps   . Palpitations - evaluated by cardiologist in 2015 06/18/2013  . Abnormal Pap smear of cervix     Past Surgical History  Procedure Laterality Date  . Lumbar disc arthroplasty    . Appendectomy    . Tonsillectomy    . Lymph node biopsy      in 2015, evaluated by ENT for abnormal lymph node in neck    Family History  Problem Relation Age of Onset  . Heart disease Father   . Hyperlipidemia Father   . Hypertension Father   . Diabetes Mother   . Hyperlipidemia Mother   . Hypertension Mother   . Mental illness Brother   . Cancer Maternal Aunt     lymphoma  . Cancer Maternal Grandfather     prostate  . Diabetes Paternal Grandmother     History   Social History  . Marital Status: Divorced    Spouse Name: N/A    Number of Children: N/A  . Years of Education: N/A   Social History Main Topics  . Smoking status: Former Games developer  . Smokeless tobacco: None     Comment: 1-2 cigs for 10 years, quit in 2014  . Alcohol Use: Yes     Comment: a few drinks per week  . Drug Use: None  . Sexual Activity: None   Other Topics Concern  . 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  lisinopril (PRINIVIL,ZESTRIL) 10 MG tablet, Take 1 tablet (10 mg total) by mouth daily., Disp: 30 tablet, Rfl: 3 .  Multiple Vitamins-Minerals (MULTIVITAMIN PO), Take by mouth., Disp: , Rfl:  .  NON FORMULARY, Tumeric, Disp: , Rfl:  .  PRESCRIPTION MEDICATION, Minivey /0.5-28 day, Disp: , Rfl:  .  Probiotic Product (ALIGN PO), Take by mouth daily., Disp: , Rfl:  .  SUMAtriptan (IMITREX) 100 MG tablet, TAKE 1 TABLET BY MOUTH EVERY DAY AS NEEDED FOR MIGRAINE HEADACHE, Disp: 20 tablet, Rfl: 3 .  venlafaxine (EFFEXOR) 37.5 MG tablet, Take 1 tablet (37.5 mg total) by mouth daily., Disp: 90 tablet, Rfl: 1  EXAM:  Filed Vitals:   02/07/14 1020  BP: 122/84  Pulse: 88  Temp: 98.8 F (37.1 C)    Body mass  index is 27.32 kg/(m^2).  GENERAL: vitals reviewed and listed above, alert, oriented, appears well hydrated and in no acute distress  HEENT: atraumatic, conjunttiva clear, no obvious abnormalities on inspection of external nose and ears, no effusion bilat ears  NECK: no obvious masses on inspection  LUNGS: clear to auscultation bilaterally, no wheezes, rales or rhonchi, good air movement  CV: HRRR, no peripheral edema  MS: moves all extremities without noticeable abnormality  PSYCH: pleasant and cooperative, no obvious depression or anxiety  ASSESSMENT AND PLAN:  Discussed the following assessment and plan:  Vertigo  Secondary hypertension, unspecified  Dyslipidemia  Other migraine without status migrainosus, not intractable  -continue lisinopril -flonase as needed during allergy season -advised physical with pap and fasting labs - she opts to do this in 3  months -Patient advised to return or notify a doctor immediately if symptoms worsen or persist or new concerns arise.  Patient Instructions  BEFORE YOUR LEAVE: -schedule your physical in about 3 months, come fasting  We recommend the following healthy lifestyle measures: - eat a healthy diet consisting of lots of vegetables, fruits, beans, nuts, seeds, healthy meats such as white chicken and fish and whole grains.  - avoid fried foods, fast food, processed foods, sodas, red meet and other fattening foods.  - get a least 150 minutes of aerobic exercise per week.       Kriste BasqueKIM, HANNAH R.

## 2014-02-07 NOTE — Progress Notes (Signed)
Pre visit review using our clinic review tool, if applicable. No additional management support is needed unless otherwise documented below in the visit note. 

## 2014-03-23 ENCOUNTER — Other Ambulatory Visit: Payer: Self-pay

## 2014-03-23 DIAGNOSIS — Z1231 Encounter for screening mammogram for malignant neoplasm of breast: Secondary | ICD-10-CM

## 2014-04-05 ENCOUNTER — Ambulatory Visit
Admission: RE | Admit: 2014-04-05 | Discharge: 2014-04-05 | Disposition: A | Discharge status: Home / Self Care | Payer: Commercial Managed Care - PPO | Source: Ambulatory Visit

## 2014-04-05 DIAGNOSIS — Z1231 Encounter for screening mammogram for malignant neoplasm of breast: Secondary | ICD-10-CM

## 2014-05-11 ENCOUNTER — Other Ambulatory Visit: Payer: Self-pay | Admitting: Family Medicine

## 2014-05-16 ENCOUNTER — Ambulatory Visit (INDEPENDENT_AMBULATORY_CARE_PROVIDER_SITE_OTHER): Payer: Commercial Managed Care - PPO | Admitting: Family Medicine

## 2014-05-16 ENCOUNTER — Encounter: Payer: Self-pay | Admitting: Family Medicine

## 2014-05-16 VITALS — BP 112/84 | HR 89 | Temp 98.2°F | Ht 64.0 in | Wt 155.3 lb

## 2014-05-16 DIAGNOSIS — Z0000 Encounter for general adult medical examination without abnormal findings: Secondary | ICD-10-CM

## 2014-05-16 DIAGNOSIS — E785 Hyperlipidemia, unspecified: Secondary | ICD-10-CM | POA: Diagnosis not present

## 2014-05-16 DIAGNOSIS — I159 Secondary hypertension, unspecified: Secondary | ICD-10-CM

## 2014-05-16 LAB — LIPID PANEL
Cholesterol: 222 mg/dL — ABNORMAL HIGH (ref 0–200)
HDL: 59.7 mg/dL (ref >39.00)
LDL Cholesterol: 128 mg/dL — ABNORMAL HIGH (ref 0–99)
NonHDL: 162.3
TRIGLYCERIDES: 174 mg/dL — AB (ref 0.0–149.0)
Total CHOL/HDL Ratio: 4
VLDL: 34.8 mg/dL (ref 0.0–40.0)

## 2014-05-16 MED ORDER — LISINOPRIL 10 MG PO TABS: 10.0000 mg | ORAL_TABLET | Freq: Every day | ORAL | Status: DC

## 2014-05-16 NOTE — Patient Instructions (Signed)
BEFORE YOU LEAVE: -labs  We recommend the following healthy lifestyle measures: - eat a healthy diet consisting of lots of vegetables, fruits, beans, nuts, seeds, healthy meats such as white chicken and fish and whole grains.  - avoid fried foods, fast food, processed foods, sodas, red meet and other fattening foods.  - get a least 150 minutes of aerobic exercise per week.   

## 2014-05-16 NOTE — Progress Notes (Signed)
Pre visit review using our clinic review tool, if applicable. No additional management support is needed unless otherwise documented below in the visit note. 

## 2014-05-16 NOTE — Progress Notes (Signed)
HPI:  Here for CPE:  -Concerns and/or follow up today: doing well on lisinopril. Feels like has increased energy and blood pressure is well controlled. She is no longer on HRT.  -Diet: variety of foods, balance and well rounded  -Exercise: regular exercise  -Taking folic acid, vitamin D or calcium: no  -Diabetes and Dyslipidemia Screening: diabetes screening done, does need lipid panel for hx mild hyperlipidemia in 2015 - FASTING today  -Hx of HTN: no  -Vaccines: Tdap  -pap history: goes to [facility] - with Azucena KubaJulie Fischer  -FDLMP: postmenopausal  -wants STI testing: declined  -FH breast, colon or ovarian ca: see FH Last mammogram: done, annually Last colon cancer screening: done  Breast Ca Risk Assessment: -sees gyn for this and may do genetic testing - sister is doing this and if positive she is going to consider  -Alcohol, Tobacco, drug use: see social history  Review of Systems - no fevers, unintentional weight loss, vision loss, hearing loss, chest pain, sob, hemoptysis, melena, hematochezia, hematuria, genital discharge, changing or concerning skin lesions, bleeding, bruising, loc, thoughts of self harm or SI  Past Medical History  Diagnosis Date  . Hypertension   . Hyperlipidemia   . Blood in stool     evaluated with GI in 2014 with EGD and colonoscopy and had hemorroids, told repeat colonoscoy in 10 years  . Frequent headaches   . Migraines   . Colon polyps   . Palpitations - evaluated by cardiologist in 2015 06/18/2013  . Abnormal Pap smear of cervix   . Fatty liver     Past Surgical History  Procedure Laterality Date  . Lumbar disc arthroplasty    . Appendectomy    . Tonsillectomy    . Lymph node biopsy      in 2015, evaluated by ENT for abnormal lymph node in neck    Family History  Problem Relation Age of Onset  . Heart disease Father   . Hyperlipidemia Father   . Hypertension Father   . Diabetes Mother   . Hyperlipidemia Mother   .  Hypertension Mother   . Mental illness Brother   . Cancer Maternal Aunt     lymphoma  . Cancer Maternal Grandfather     prostate  . Diabetes Paternal Grandmother   . Ovarian cancer      sister    History   Social History  . Marital Status: Divorced    Spouse Name: N/A  . Number of Children: N/A  . Years of Education: N/A   Social History Main Topics  . Smoking status: Former Games developermoker  . Smokeless tobacco: Not on file     Comment: 1-2 cigs for 10 years, quit in 2014  . Alcohol Use: Yes     Comment: a few drinks per week  . Drug Use: Not on file  . Sexual Activity: Not on file   Other Topics Concern  . 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  lisinopril (PRINIVIL,ZESTRIL) 10 MG tablet, Take 1 tablet (  10 mg total) by mouth daily., Disp: 90 tablet, Rfl: 3 .  Multiple Vitamins-Minerals (MULTIVITAMIN PO), Take by mouth., Disp: , Rfl:  .  NON FORMULARY, Tumeric, Disp: , Rfl:  .  Probiotic Product (ALIGN PO), Take by mouth daily., Disp: , Rfl:  .  SUMAtriptan (IMITREX) 100 MG tablet, TAKE 1 TABLET BY MOUTH EVERY DAY AS NEEDED FOR MIGRAINE HEADACHE, Disp: 20 tablet, Rfl: 3 .  venlafaxine (EFFEXOR) 37.5 MG tablet, Take 1 tablet (37.5 mg total) by mouth daily., Disp: 90 tablet, Rfl: 1  EXAM:  Filed Vitals:   05/16/14 0918  BP: 112/84  Pulse: 89  Temp: 98.2 F (36.8 C)    GENERAL: vitals reviewed and listed below, alert, oriented, appears well hydrated and in no acute distress  HEENT: head atraumatic, PERRLA, normal appearance of eyes, ears, nose and mouth. moist mucus membranes.  NECK: supple, no masses or  lymphadenopathy  LUNGS: clear to auscultation bilaterally, no rales, rhonchi or wheeze  CV: HRRR, no peripheral edema or cyanosis, normal pedal pulses  BREAST: declined - does with gyn  ABDOMEN: bowel sounds normal, soft, non tender to palpation, no masses, no rebound or guarding  GU: declined - does with gyn  RECTAL: refused  - done with gyn  SKIN: no rash or abnormal lesions  MS: normal gait, moves all extremities normally  NEURO: CN II-XII grossly intact, normal muscle strength and sensation to light touch on extremities  PSYCH: normal affect, pleasant and cooperative  ASSESSMENT AND PLAN:  Discussed the following assessment and plan:  Visit for preventive health examination  Secondary hypertension, unspecified  Dyslipidemia - Plan: Lipid panel  -Discussed and advised all [place] preventive services health task force level A and B recommendations for age, sex and risks.  -Advised at least 150 minutes of exercise per week and a healthy diet low in saturated fats and sweets and consisting of fresh fruits and vegetables, lean meats such as fish and white chicken and whole grains.  -she is seesing gyn for her FH ovarian ca and female exam, considering genetic testing  -FASTING labs, studies and vaccines per orders this encounter  Orders Placed This Encounter  Procedures  . Lipid panel    Patient advised to return to clinic immediately if symptoms worsen or persist or new concerns.  There are no Patient Instructions on file for this visit.  No Follow-up on file.  Kriste BasqueKIM, Shyloh Krinke R.

## 2014-08-23 ENCOUNTER — Ambulatory Visit (INDEPENDENT_AMBULATORY_CARE_PROVIDER_SITE_OTHER): Payer: Commercial Managed Care - PPO | Admitting: Family Medicine

## 2014-08-23 ENCOUNTER — Encounter: Payer: Self-pay | Admitting: Family Medicine

## 2014-08-23 VITALS — BP 108/82 | HR 83 | Temp 98.5°F | Ht 64.0 in | Wt 160.0 lb

## 2014-08-23 DIAGNOSIS — R0600 Dyspnea, unspecified: Secondary | ICD-10-CM

## 2014-08-23 DIAGNOSIS — E785 Hyperlipidemia, unspecified: Secondary | ICD-10-CM | POA: Diagnosis not present

## 2014-08-23 DIAGNOSIS — Q178 Other specified congenital malformations of ear: Secondary | ICD-10-CM

## 2014-08-23 DIAGNOSIS — I159 Secondary hypertension, unspecified: Secondary | ICD-10-CM | POA: Diagnosis not present

## 2014-08-23 DIAGNOSIS — Q179 Congenital malformation of ear, unspecified: Secondary | ICD-10-CM

## 2014-08-23 DIAGNOSIS — R079 Chest pain, unspecified: Secondary | ICD-10-CM | POA: Diagnosis not present

## 2014-08-23 MED ORDER — VENLAFAXINE HCL 37.5 MG PO TABS: 37.5000 mg | ORAL_TABLET | Freq: Every day | ORAL | Status: DC

## 2014-08-23 NOTE — Progress Notes (Signed)
Pre visit review using our clinic review tool, if applicable. No additional management support is needed unless otherwise documented below in the visit note. 

## 2014-08-23 NOTE — Progress Notes (Signed)
HPI:  Emily Fowler is a 59 yo F with a PMH sig for HTN HLD here for an acute visit for:  Dyspnea: -she worries about her heart because her father passed away suddenly at age 69 from suspected CAD -now recently her brother had an abnormal heart study with mild CAD -recently pt has noticed some mild dyspnea with walking up hills -she is getting aerobic exercise 3 times per week and has no CP or palpitations usually, but did have one episode of pressure in chest a few months ago when on treadmill and this resolved with rest -denies: fevers, cough, congestion, malaise, weight loss, jaw pain, CP or discomfort other then episode mentioned above -she had a holter monitor and echo done in 2015 for palpitations which she reports were fine, she does not remember the name of the physician and I do not have these reports   Occ popping in the L ear/Vertigo: -reports recurred of flonase -feels a little off balance a sometimes -she is no longer taking the flonase - stopped a long time ago   ROS: See pertinent positives and negatives per HPI.  Past Medical History  Diagnosis Date  . Hypertension   . Hyperlipidemia   . Blood in stool     evaluated with GI in 2014 with EGD and colonoscopy and had hemorroids, told repeat colonoscoy in 10 years  . Frequent headaches   . Migraines   . Colon polyps   . Palpitations - evaluated by cardiologist in 2015 06/18/2013  . Abnormal Pap smear of cervix   . Fatty liver     Past Surgical History  Procedure Laterality Date  . Lumbar disc arthroplasty    . Appendectomy    . Tonsillectomy    . Lymph node biopsy      in 2015, evaluated by ENT for abnormal lymph node in neck    Family History  Problem Relation Age of Onset  . Heart disease Father   . Hyperlipidemia Father   . Hypertension Father   . Diabetes Mother   . Hyperlipidemia Mother   . Hypertension Mother   . Mental illness Brother   . Cancer Maternal Aunt     lymphoma  . Cancer Maternal  Grandfather     prostate  . Diabetes Paternal Grandmother   . Ovarian cancer      sister    Social History   Social History  . Marital Status: Divorced    Spouse Name: N/A  . Number of Children: N/A  . Years of Education: N/A   Social History Main Topics  . Smoking status: Former Games developer  . Smokeless tobacco: None     Comment: 1-2 cigs for 10 years, quit in 2014  . Alcohol Use: Yes     Comment: a few drinks per week  . Drug Use: None  . Sexual Activity: Not Asked   Other Topics Concern  . 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  lisinopril (PRINIVIL,ZESTRIL) 10 MG tablet, Take 1 tablet (10 mg total)  by mouth daily., Disp: 90 tablet, Rfl: 3 .  Multiple Vitamins-Minerals (MULTIVITAMIN PO), Take by mouth., Disp: , Rfl:  .  NON FORMULARY, Tumeric, Disp: , Rfl:  .  Probiotic Product (ALIGN PO), Take by mouth daily., Disp: , Rfl:  .  SUMAtriptan (IMITREX) 100 MG tablet, TAKE 1 TABLET BY MOUTH EVERY DAY AS NEEDED FOR MIGRAINE HEADACHE, Disp: 20 tablet, Rfl: 3 .  venlafaxine (EFFEXOR) 37.5 MG tablet, Take 1 tablet (37.5 mg total) by mouth daily., Disp: 90 tablet, Rfl: 1  EXAM:  Filed Vitals:   08/23/14 1002  BP: 108/82  Pulse: 83  Temp: 98.5 F (36.9 C)    Body mass index is 27.45 kg/(m^2).  GENERAL: vitals reviewed and listed above, alert, oriented, appears well hydrated and in no acute distress  HEENT: atraumatic, conjunttiva clear, no obvious abnormalities on inspection of external nose and ears  NECK: no obvious masses on inspection  LUNGS: clear to auscultation bilaterally, no wheezes, rales or rhonchi,  good air movement  CV: HRRR, no peripheral edema  MS: moves all extremities without noticeable abnormality  PSYCH: pleasant and cooperative, no obvious depression or anxiety  ASSESSMENT AND PLAN:  Discussed the following assessment and plan:  Chest pain, unspecified chest pain type - Plan: Exercise Tolerance Test Dyspnea - Plan: Exercise Tolerance Test -we discussed possible serious and likely etiologies, workup and treatment, treatment risks and return precautions -after this discussion, Jency opted for exercise stress test -follow up advised in 1 month -of course, we advised [place]  to return or notify a doctor immediately if symptoms worsen or persist or new concerns arise and to seek care immediately if any further events like the one described that occurred several months ago  Secondary hypertension, unspecified  Dyslipidemia  Eustachian tube anomaly/balance issues: -discussed etiologies, resolved in the past on flonase so advised try this but advise will need further eval with MRI, ENT or neuro if persists  -Patient advised to return or notify a doctor immediately if symptoms worsen or persist or new concerns arise.  Patient Instructions  BEFORE YOU LEAVE: -schedule follow up in 1 month  -We placed a referral for you as discussed for the stress test. It usually takes about 1-2 weeks to process and schedule this referral. If you have not heard from [place] regarding this appointment in 2 weeks please contact our office.  -flonase 2 sprays each nostril daily for 1 month, then 1 spray each nostril daily     Emily Fowler R.

## 2014-08-23 NOTE — Patient Instructions (Signed)
BEFORE YOU LEAVE: -schedule follow up in 1 month  -We placed a referral for you as discussed for the stress test. It usually takes about 1-2 weeks to process and schedule this referral. If you have not heard from [place] regarding this appointment in 2 weeks please contact our office.  -flonase 2 sprays each nostril daily for 1 month, then 1 spray each nostril daily

## 2014-08-23 NOTE — Addendum Note (Signed)
Addended by: Johnella Moloney on: 08/23/2014 10:50 AM   Modules accepted: Orders

## 2014-08-30 ENCOUNTER — Ambulatory Visit (HOSPITAL_COMMUNITY)
Admission: RE | Admit: 2014-08-30 | Discharge: 2014-08-30 | Disposition: A | Discharge status: Home / Self Care | Payer: Commercial Managed Care - PPO | Source: Ambulatory Visit | Attending: Cardiology | Admitting: Cardiology

## 2014-08-30 DIAGNOSIS — R079 Chest pain, unspecified: Secondary | ICD-10-CM | POA: Insufficient documentation

## 2014-08-30 DIAGNOSIS — R0600 Dyspnea, unspecified: Secondary | ICD-10-CM | POA: Diagnosis not present

## 2014-08-30 LAB — EXERCISE TOLERANCE TEST
CHL CUP MPHR: 163 {beats}/min
CHL CUP RESTING HR STRESS: 86 {beats}/min
CHL RATE OF PERCEIVED EXERTION: 15
CSEPHR: 107 %
Estimated workload: 12.4 METS
Exercise duration (min): 10 min
Exercise duration (sec): 25 s
Peak HR: 176 {beats}/min

## 2014-10-03 ENCOUNTER — Ambulatory Visit (INDEPENDENT_AMBULATORY_CARE_PROVIDER_SITE_OTHER): Payer: Commercial Managed Care - PPO | Admitting: Family Medicine

## 2014-10-03 ENCOUNTER — Encounter: Payer: Self-pay | Admitting: Family Medicine

## 2014-10-03 VITALS — BP 132/88 | HR 95 | Temp 98.4°F | Ht 64.0 in | Wt 153.8 lb

## 2014-10-03 DIAGNOSIS — R0789 Other chest pain: Secondary | ICD-10-CM | POA: Diagnosis not present

## 2014-10-03 DIAGNOSIS — E785 Hyperlipidemia, unspecified: Secondary | ICD-10-CM

## 2014-10-03 DIAGNOSIS — F330 Major depressive disorder, recurrent, mild: Secondary | ICD-10-CM

## 2014-10-03 DIAGNOSIS — I159 Secondary hypertension, unspecified: Secondary | ICD-10-CM

## 2014-10-03 DIAGNOSIS — H8110 Benign paroxysmal vertigo, unspecified ear: Secondary | ICD-10-CM

## 2014-10-03 NOTE — Progress Notes (Signed)
Pre visit review using our clinic review tool, if applicable. No additional management support is needed unless otherwise documented below in the visit note. 

## 2014-10-03 NOTE — Progress Notes (Addendum)
HPI:  Atypical CP: -occurred several months ago, ESE negative -no further episodes of this or SOB -reports exercising and feels great  Eustachian tube dysfunction: -reports resolved with flonase  Positional Vertigo: -one episode a few weeks ago -mild vertigo when turn head a certain way only, would last for a few seconds then resolve, lasted for one day -no episodes since and feels fine otherwise -denies: HA, CP, SOB, palpitations  HTN/mild HLD: -she is working aggressively on diet and exercise -meds: lisinopril -denies: CP, SOB, DOE, swelling   ROS: See pertinent positives and negatives per HPI.  Past Medical History  Diagnosis Date  . Hypertension   . Hyperlipidemia   . Blood in stool     evaluated with GI in 2014 with EGD and colonoscopy and had hemorroids, told repeat colonoscoy in 10 years  . Frequent headaches   . Migraines   . Colon polyps   . Palpitations - evaluated by cardiologist in 2015 06/18/2013  . Abnormal Pap smear of cervix   . Fatty liver     Past Surgical History  Procedure Laterality Date  . Lumbar disc arthroplasty    . Appendectomy    . Tonsillectomy    . Lymph node biopsy      in 2015, evaluated by ENT for abnormal lymph node in neck    Family History  Problem Relation Age of Onset  . Heart disease Father   . Hyperlipidemia Father   . Hypertension Father   . Diabetes Mother   . Hyperlipidemia Mother   . Hypertension Mother   . Mental illness Brother   . Cancer Maternal Aunt     lymphoma  . Cancer Maternal Grandfather     prostate  . Diabetes Paternal Grandmother   . Ovarian cancer      sister    Social History   Social History  . Marital Status: Divorced    Spouse Name: N/A  . Number of Children: N/A  . Years of Education: N/A   Social History Main Topics  . Smoking status: Former Games developer  . Smokeless tobacco: None     Comment: 1-2 cigs for 10 years, quit in 2014  . Alcohol Use: Yes     Comment: a few drinks per week   . Drug Use: None  . Sexual Activity: Not Asked   Other Topics Concern  . 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  lisinopril (PRINIVIL,ZESTRIL) 10 MG tablet, Take 1 tablet (10 mg total) by mouth daily., Disp: 90 tablet, Rfl: 3 .  Multiple Vitamins-Minerals (MULTIVITAMIN PO), Take by mouth., Disp: , Rfl:  .  NON FORMULARY, Tumeric, Disp: , Rfl:  .  Probiotic Product (ALIGN PO), Take by mouth daily., Disp: , Rfl:  .  SUMAtriptan (IMITREX) 100 MG tablet, TAKE 1 TABLET BY MOUTH EVERY DAY AS NEEDED FOR MIGRAINE HEADACHE, Disp: 20 tablet, Rfl: 3 .  venlafaxine (EFFEXOR) 37.5 MG tablet, Take 1 tablet (37.5 mg total) by mouth daily., Disp: 30 tablet, Rfl: 0  EXAM:  Filed  Vitals:   10/03/14 1003  BP: 132/88  Pulse: 95  Temp: 98.4 F (36.9 C)    Body mass index is 26.39 kg/(m^2).  GENERAL: vitals reviewed and listed above, alert, oriented, appears well hydrated and in no acute distress  HEENT: atraumatic, conjunttiva clear, no obvious abnormalities on inspection of external nose and ears  NECK: no obvious masses on inspection  LUNGS: clear to auscultation bilaterally, no wheezes, rales or rhonchi, good air movement  CV: HRRR, no peripheral edema  MS: moves all extremities without noticeable abnormality  PSYCH: pleasant and cooperative, no obvious depression or anxiety  ASSESSMENT AND PLAN:  Discussed the following assessment and plan:  Atypical chest pain -resolved, no further episodes, stress test neg  Secondary hypertension, unspecified -stable, lifestyle recs, continue medication  Dyslipidemia -cont lifestyle  changes, recheck at physical  Benign paroxysmal positional vertigo, unspecified laterality -advised to call if recurs or persistent or frequent then would do further eval during episode, vestibular rehab and MRI if persistent  -Patient advised to return or notify a doctor immediately if symptoms worsen or persist or new concerns arise.  Patient Instructions  BEFORE YOU LEAVE: -schedule Physical in May, come fasting and we will plan to check labs then  We recommend the following healthy lifestyle measures: - eat a healthy diet consisting of small portions of vegetables, fruits, beans, nuts, seeds, healthy meats such as white chicken and fish and whole grains.  - avoid sweets, white starches, fried foods, fast food, processed foods, sodas, red meet and other fattening foods.  - get a least 150-300 minutes of aerobic exercise per week.   Call if vertigo returns or persist      KIM, HANNAH R.

## 2014-10-03 NOTE — Patient Instructions (Signed)
BEFORE YOU LEAVE: -schedule Physical in May, come fasting and we will plan to check labs then  We recommend the following healthy lifestyle measures: - eat a healthy diet consisting of small portions of vegetables, fruits, beans, nuts, seeds, healthy meats such as white chicken and fish and whole grains.  - avoid sweets, white starches, fried foods, fast food, processed foods, sodas, red meet and other fattening foods.  - get a least 150-300 minutes of aerobic exercise per week.   Call if vertigo returns or persist

## 2014-10-26 ENCOUNTER — Other Ambulatory Visit: Payer: Self-pay | Admitting: Family Medicine

## 2014-11-13 ENCOUNTER — Other Ambulatory Visit: Payer: Self-pay | Admitting: Family Medicine

## 2014-11-14 MED ORDER — SUMATRIPTAN SUCCINATE 100 MG PO TABS: ORAL_TABLET | ORAL | Status: DC

## 2014-11-14 NOTE — Addendum Note (Signed)
Addended by: Johnella MoloneyFUNDERBURK, Yeny Schmoll A on: 11/14/2014 01:10 PM   Modules accepted: Orders

## 2014-11-14 NOTE — Telephone Encounter (Signed)
Rx re-sent due to transmission failure.

## 2014-11-14 NOTE — Addendum Note (Signed)
Addended by: Johnella MoloneyFUNDERBURK, Bensyn Bornemann A on: 11/14/2014 01:05 PM   Modules accepted: Orders

## 2014-11-18 ENCOUNTER — Ambulatory Visit: Payer: Commercial Managed Care - PPO | Admitting: Family Medicine

## 2014-12-08 ENCOUNTER — Ambulatory Visit (INDEPENDENT_AMBULATORY_CARE_PROVIDER_SITE_OTHER): Payer: Commercial Managed Care - PPO | Admitting: Family Medicine

## 2014-12-08 ENCOUNTER — Encounter: Payer: Self-pay | Admitting: Family Medicine

## 2014-12-08 VITALS — BP 120/80 | HR 82 | Temp 98.7°F | Ht 64.0 in | Wt 156.5 lb

## 2014-12-08 DIAGNOSIS — J069 Acute upper respiratory infection, unspecified: Secondary | ICD-10-CM

## 2014-12-08 NOTE — Patient Instructions (Signed)

## 2014-12-08 NOTE — Progress Notes (Signed)
Pre visit review using our clinic review tool, if applicable. No additional management support is needed unless otherwise documented below in the visit note. 

## 2014-12-08 NOTE — Progress Notes (Signed)
HPI:  URI: -started: 3-4 days ago -symptoms:nasal congestion, sore throat, cough -denies:fever, SOB, NVD, tooth pain -has tried: take flonase for allergies -sick contacts/travel/risks: denies flu exposure, tick exposure or or Ebola risks -Hx of: allergies  ROS: See pertinent positives and negatives per HPI.  Past Medical History  Diagnosis Date  . Hypertension   . Hyperlipidemia   . Blood in stool     evaluated with GI in 2014 with EGD and colonoscopy and had hemorroids, told repeat colonoscoy in 10 years  . Frequent headaches   . Migraines   . Colon polyps   . Palpitations - evaluated by cardiologist in 2015 06/18/2013  . Abnormal Pap smear of cervix   . Fatty liver     Past Surgical History  Procedure Laterality Date  . Lumbar disc arthroplasty    . Appendectomy    . Tonsillectomy    . Lymph node biopsy      in 2015, evaluated by ENT for abnormal lymph node in neck    Family History  Problem Relation Age of Onset  . Heart disease Father   . Hyperlipidemia Father   . Hypertension Father   . Diabetes Mother   . Hyperlipidemia Mother   . Hypertension Mother   . Mental illness Brother   . Cancer Maternal Aunt     lymphoma  . Cancer Maternal Grandfather     prostate  . Diabetes Paternal Grandmother   . Ovarian cancer      sister    Social History   Social History  . Marital Status: Divorced    Spouse Name: N/A  . Number of Children: N/A  . Years of Education: N/A   Social History Main Topics  . Smoking status: Former Games developer  . Smokeless tobacco: None     Comment: 1-2 cigs for 10 years, quit in 2014  . Alcohol Use: Yes     Comment: a few drinks per week  . Drug Use: None  . Sexual Activity: Not Asked   Other Topics Concern  . 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  lisinopril (PRINIVIL,ZESTRIL) 10 MG tablet, Take 1 tablet (10 mg total) by mouth daily., Disp: 90 tablet, Rfl: 3 .  Multiple Vitamins-Minerals (MULTIVITAMIN PO), Take by mouth., Disp: , Rfl:  .  NON FORMULARY, Tumeric, Disp: , Rfl:  .  Probiotic Product (ALIGN PO), Take by mouth daily., Disp: , Rfl:  .  SUMAtriptan (IMITREX) 100 MG tablet, TAKE 1 TABLET BY MOUTH EVERY DAY AS NEEDED FOR MIGRAINE HEADACHE, Disp: 10 tablet, Rfl: 0 .  venlafaxine (EFFEXOR) 37.5 MG tablet, TAKE 1 TABLET BY MOUTH EVERY DAY, Disp: 30 tablet, Rfl: 3  EXAM:  Filed Vitals:   12/08/14 1113  BP: 120/80  Pulse: 82  Temp: 98.7 F (37.1 C)    Body mass index is 26.85 kg/(m^2).  GENERAL: vitals reviewed and listed above, alert, oriented, appears well hydrated and in no acute distress  HEENT: atraumatic, conjunttiva clear, no obvious abnormalities on inspection of external nose  and ears, normal appearance of ear canals and TMs, clear nasal congestion, mild post oropharyngeal erythema with PND, no tonsillar edema or exudate, no sinus TTP  NECK: no obvious masses on inspection  LUNGS: clear to auscultation bilaterally, no wheezes, rales or rhonchi, good air movement  CV: HRRR, no peripheral edema  MS: moves all extremities without noticeable abnormality  PSYCH: pleasant and cooperative, no obvious depression or anxiety  ASSESSMENT AND PLAN:  Discussed the following assessment and plan:  Acute upper respiratory infection  -given HPI and exam findings today, a serious infection or illness is unlikely. We discussed potential etiologies, with VURI being most likely, and advised supportive care and monitoring. We discussed treatment side effects, likely course, antibiotic misuse, transmission, and signs of developing a serious  illness. -of course, we advised to return or notify a doctor immediately if symptoms worsen or persist or new concerns arise.    Patient Instructions  INSTRUCTIONS FOR UPPER RESPIRATORY INFECTION:  -plenty of rest and fluids  -nasal saline wash 2-3 times daily (use prepackaged nasal saline or bottled/distilled water if making your own)   -can use AFRIN nasal spray for drainage and nasal congestion - but do NOT use longer then 3-4 days  -can use tylenol (in no history of liver disease) or ibuprofen (if no history of kidney disease, bowel bleeding or significant heart disease) as directed for aches and sorethroat  -in the winter time, using a humidifier at night is helpful (please follow cleaning instructions)  -if you are taking a cough medication - use only as directed, may also try a teaspoon of honey to coat the throat and throat lozenges. If given a cough medication with codeine or hydrocodone or other narcotic please be advised that this contains a strong and  potentially addicting medication. Please follow instructions carefully, take as little as possible and only use AS NEEDED for severe cough. Discuss potential side effects with your pharmacy. Please do not drive or operate machinery while taking these types of medications. Please do not take other sedating medications, drugs or alcohol while taking this medication without discussing with your doctor.  -for sore throat, salt water gargles can help  -follow up if you have fevers, facial pain, tooth pain, difficulty breathing or are worsening or symptoms persist longer then expected  Upper Respiratory Infection, Adult An upper respiratory infection (URI) is also known as the common cold. It is often caused by a type of germ (virus). Colds are easily spread (contagious). You can pass it to others by kissing, coughing, sneezing, or drinking out of the same glass. Usually, you get better in 1 to 3  weeks.  However, the cough can last for  even longer. HOME CARE   Only take medicine as told by your doctor. Follow instructions provided above.  Drink enough water and fluids to keep your pee (urine) clear or pale yellow.  Get plenty of rest.  Return to work when your temperature is < 100 for 24 hours or as told by your doctor. You may use a face mask and wash your hands to stop your cold from spreading. GET HELP RIGHT AWAY IF:   After the first few days, you feel you are getting worse.  You have questions about your medicine.  You have chills, shortness of breath, or red spit (mucus).  You have pain in the face for more then 1-2 days, especially when you bend forward.  You have a fever, puffy (swollen) neck, pain when you swallow,  or white spots in the back of your throat.  You have a bad headache, ear pain, sinus pain, or chest pain.  You have a high-pitched whistling sound when you breathe in and out (wheezing).  You cough up blood.  You have sore muscles or a stiff neck. MAKE SURE YOU:   Understand these instructions.  Will watch your condition.  Will get help right away if you are not doing well or get worse. Document Released: 06/12/2007 Document Revised: 03/18/2011 Document Reviewed: 03/31/2013 [facility] Patient Information 2015 [employer] [place]. This information is not intended to replace advice given to you by your health care provider. Make sure you discuss any questions you have with your health care provider.      Kriste BasqueKIM, Camie Hauss R.

## 2014-12-24 ENCOUNTER — Other Ambulatory Visit: Payer: Self-pay | Admitting: Family Medicine

## 2015-03-24 ENCOUNTER — Other Ambulatory Visit: Payer: Self-pay | Admitting: Family Medicine

## 2015-03-31 ENCOUNTER — Other Ambulatory Visit: Payer: Self-pay

## 2015-03-31 DIAGNOSIS — Z1231 Encounter for screening mammogram for malignant neoplasm of breast: Secondary | ICD-10-CM

## 2015-04-14 ENCOUNTER — Ambulatory Visit: Payer: Commercial Managed Care - PPO

## 2015-04-17 ENCOUNTER — Telehealth: Payer: Self-pay | Admitting: Neurology

## 2015-04-17 ENCOUNTER — Ambulatory Visit (INDEPENDENT_AMBULATORY_CARE_PROVIDER_SITE_OTHER): Payer: Commercial Managed Care - PPO | Admitting: Neurology

## 2015-04-17 ENCOUNTER — Encounter: Payer: Self-pay | Admitting: Neurology

## 2015-04-17 VITALS — BP 140/82 | HR 92 | Ht 64.0 in | Wt 157.0 lb

## 2015-04-17 DIAGNOSIS — R202 Paresthesia of skin: Secondary | ICD-10-CM | POA: Diagnosis not present

## 2015-04-17 DIAGNOSIS — M542 Cervicalgia: Secondary | ICD-10-CM

## 2015-04-17 DIAGNOSIS — G250 Essential tremor: Secondary | ICD-10-CM | POA: Diagnosis not present

## 2015-04-17 NOTE — Telephone Encounter (Signed)
Patient made aware.

## 2015-04-17 NOTE — Telephone Encounter (Signed)
Let pt know that her AchR Ab's were negative

## 2015-04-17 NOTE — Progress Notes (Signed)
Subjective:   Emily Fowler was seen in consultation in the movement disorder clinic at the request of Levon Hedger, MD.  The evaluation is for tremor.  I have reviewed previous records made available to me.  The patient is a 60 y.o. right handed female with a history of tremor.  Tremor has been going on for 2+ years but has been worse over 6-8 months.  It is worse when she holds things like the newspaper or her phone.  When she does transvaginal u/s she has significant more tremor.  She doesn't notice it when she eats, even when she eats soup.  There is a  family hx of tremor in her mother, maternal grandmother (sounds like jaw tremor), maternal cousin (facial/jaw tremor); maternal aunt and uncle.    Affected by caffeine:  No. (1 cup coffee/ day; 1 cup tea/day) Affected by alcohol:  Doesn't drink enough to know (1 glass wine on weekend) Affected by stress: doesn't know Affected by fatigue:  Yes.   Spills soup if on spoon:  No. Spills glass of liquid if full:  No. Affects ADL's (tying shoes, brushing teeth, etc):  No. (but little trouble with eye liner)  Current/Previously tried tremor medications: none  Current medications that may exacerbate tremor:  n/a  She does see Dr. Cathey Endow for ophthalmology and has talked to him about by twitching and occasional drooping.  States that it will only "droop" for 1-2 seconds and is associated with a "spasm" and Dr. Cathey Endow told her he thought it was blepharospasm.  He did check AchR Ab but that is not back yet.  They were drawn 3 weeks ago.    States that she was told that 10 years ago she needed surgery in neck but declined it.  Still has intermittent pain but better.  Paresthesias in the hands at night, and seems to be on the side she is not sleeping on.  All fingers involved. She thinks that it is from the neck.    She has a hx of migraine.  She uses imitrex 3 times a month, but admits that she breaks it in half.  Seems to work that way.     Outside reports reviewed: lab reports and office notes.  No Known Allergies  Outpatient Encounter Prescriptions as of 04/17/2015  Medication Sig  . Estradiol-Norethindrone Acet (MIMVEY LO) 0.5-0.1 MG tablet Take 1 tablet by mouth daily.  Marland Kitchen lisinopril (PRINIVIL,ZESTRIL) 10 MG tablet Take 1 tablet (10 mg total) by mouth daily.  . Multiple Vitamins-Minerals (MULTIVITAMIN PO) Take by mouth.  . Probiotic Product (ALIGN PO) Take by mouth daily.  . SUMAtriptan (IMITREX) 100 MG tablet TAKE 1 TABLET EVERY DAY AS NEEDED FOR MIGRAINE HEADACHE  . venlafaxine (EFFEXOR) 37.5 MG tablet TAKE 1 TABLET BY MOUTH EVERY DAY  . [DISCONTINUED] NON FORMULARY Tumeric   No facility-administered encounter medications on file as of 04/17/2015.    Past Medical History  Diagnosis Date  . Hypertension   . Hyperlipidemia   . Blood in stool     evaluated with GI in 2014 with EGD and colonoscopy and had hemorroids, told repeat colonoscoy in 10 years  . Frequent headaches   . Migraines   . Colon polyps   . Palpitations - evaluated by cardiologist in 2015 06/18/2013  . Abnormal Pap smear of cervix   . Fatty liver     NASH with normal LFT's  . Depression     Past Surgical History  Procedure Laterality Date  .  Lumbar disc arthroplasty    . Appendectomy    . Tonsillectomy    . Lymph node biopsy      in 2015, evaluated by ENT for abnormal lymph node in neck    Social History   Social History  . Marital Status: Divorced    Spouse Name: N/A  . Number of Children: N/A  . Years of Education: N/A   Occupational History  . ultrasound tech     [employer]   Social History Main Topics  . Smoking status: Former Smoker    Quit date: 01/08/2012  . Smokeless tobacco: Not on file     Comment: 1-2 cigs for 10 years, quit in 2014  . Alcohol Use: 0.0 oz/week    0 Standard drinks or equivalent per week     Comment: couple drinks on weekend  . Drug Use: No  . Sexual Activity: Not on file   Other Topics Concern  .  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 Father Deceased 76    MI  . Mother Alive     DM, heart disease, ET  . Brother Alive     HTN  . Brother Alive     HTN, depression, anxiety  . Sister Alive     ovarian cancer, HTN  . Sister Alive     HTN  . Maternal Grandmother Deceased     ET (also maternal aunts with tremor)  . Child Alive     3, alive and well    Review of Systems  A complete 10 system ROS was obtained and was negative apart from what is mentioned.   Objective:   VITALS:   Filed Vitals:   04/17/15 1009  BP: 140/82  Pulse: 92  Height:  (1.626 m)  Weight: 157 lb (71.215 kg)   Gen:  Appears stated age and in NAD. HEENT:  Normocephalic, atraumatic. The mucous membranes are moist. The superficial temporal arteries are without ropiness or tenderness. Cardiovascular: Regular rate and rhythm. Lungs: Clear to auscultation bilaterally. Neck: There are no carotid bruits noted bilaterally.  NEUROLOGICAL:  Orientation:  The patient is alert and oriented x 3.  Recent and remote memory are intact.  Attention span and concentration are normal.  Able to name objects and repeat without trouble.  Fund of knowledge is appropriate Cranial nerves: There is good facial symmetry. The pupils are equal round and reactive to light bilaterally. Fundoscopic exam reveals clear disc margins bilaterally. Extraocular muscles are intact and visual fields are full to confrontational testing. Speech is fluent and clear. Soft palate rises symmetrically and there is no tongue deviation. Hearing is intact to conversational  tone. Tone: Tone is good throughout. Sensation: Sensation is intact to light touch and pinprick throughout (facial, trunk, extremities). No change in sensation across the hands, including across the bilateral APB.  Vibration is intact at the bilateral big toe. There is no extinction with double simultaneous stimulation. There is no sensory dermatomal level identified. Coordination:  The  patient has no dysdiadichokinesia or dysmetria. Motor: Strength is 5/5 in the bilateral upper and lower extremities.  Shoulder shrug is equal bilaterally.  There is no pronator drift.  There are no fasciculations noted. DTR's: Deep tendon reflexes are 2/4 at the bilateral biceps, triceps, brachioradialis, patella and achilles.  Plantar responses are downgoing bilaterally. Gait and Station: The patient is able to ambulate without difficulty. The patient is able to heel toe walk without any difficulty. The patient is able to ambulate in a tandem fashion. The patient is able to stand in the Romberg position.   MOVEMENT EXAM: Tremor:  There is no tremor of the outstretched hands.  She has no difficulty with Archimedes spirals.  She is able to write a sentence in cursive without any tremor.  She has very mild tremor when asked to pour a full glass of water from one glass to another.  Labs: She had laboratory her primary care physician office on April 3.  Her cholesterol was elevated at 237.  Her AST was 21 and ALT was 25.  Her last TSH was done in January, 2016 and was 1.32.  She does tell me that it was re-drawn through Dr. Cathey Endow and it was normal.       Assessment/Plan:   1.  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  -She has a long family history of this, although hers is very mild.    We discussed nature and pathophysiology.  We discussed that this can continue to gradually get worse with time.  We discussed that some medications can worsen this, as can caffeine use.  We discussed medication therapy as well as surgical therapy.   Ultimately, the patient decided to hold off on any further treatment or therapeutics.  She had initially asked me about an MRI of the brain, but I told her that I definitely would not do it for this.  I saw nothing focal or lateralizing on her examination.  She will certainly let me know if she changes her mind on medication therapy and she will get a follow-up appointment.  2.  Hand paresthesias.  -I did recommend an EMG.  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  However, it could represent cervical radiculopathy.  She was told years ago she needed neck surgery and opted not to have that done.  Her neck pain has been somewhat better the years, although she still has it.  After a long discussion, she decided to hold off on any further neuro imaging, which I do think is fairly reasonable right now.  She will let me know if her clinical situation changes.  3.  Intermittent ptosis  -I did not see any today and she really describes it less as ptosis and more as a twitching of the right eye.  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  I did not see that today, but I did try to call the lab where she had her acetylcholine receptor antibodies done by the ophthalmologist.  This was done about 3 weeks ago and both the ophthalmologist and myself are still trying to track that down.  The lab stated that they would fax them to me when available.  I did tell the patient that if she had sustained ptosis, shortness of breath, diplopia or any other new neurologic symptoms, I wanted her to call me and we needed to explore this further.  She expressed understanding.  4.  I will f/u with her prn.  Will call her with results of EMG testing.  Much greater than 50% of this visit was spent in counseling and coordinating care.  Total face to face time:  60 min '

## 2015-05-01 ENCOUNTER — Ambulatory Visit: Payer: Commercial Managed Care - PPO | Admitting: Neurology

## 2015-05-08 ENCOUNTER — Ambulatory Visit
Admission: RE | Admit: 2015-05-08 | Discharge: 2015-05-08 | Disposition: A | Discharge status: Home / Self Care | Payer: Commercial Managed Care - PPO | Source: Ambulatory Visit

## 2015-05-08 DIAGNOSIS — Z1231 Encounter for screening mammogram for malignant neoplasm of breast: Secondary | ICD-10-CM

## 2015-05-10 ENCOUNTER — Other Ambulatory Visit: Payer: Self-pay | Admitting: Internal Medicine

## 2015-05-10 DIAGNOSIS — R928 Other abnormal and inconclusive findings on diagnostic imaging of breast: Secondary | ICD-10-CM

## 2015-05-15 ENCOUNTER — Ambulatory Visit
Admission: RE | Admit: 2015-05-15 | Discharge: 2015-05-15 | Disposition: A | Discharge status: Home / Self Care | Payer: Commercial Managed Care - PPO | Source: Ambulatory Visit | Attending: Internal Medicine | Admitting: Internal Medicine

## 2015-05-15 DIAGNOSIS — R928 Other abnormal and inconclusive findings on diagnostic imaging of breast: Secondary | ICD-10-CM

## 2015-05-24 ENCOUNTER — Encounter: Payer: Commercial Managed Care - PPO | Admitting: Family Medicine

## 2015-06-01 ENCOUNTER — Other Ambulatory Visit: Payer: Self-pay | Admitting: Family Medicine

## 2015-06-06 ENCOUNTER — Ambulatory Visit (INDEPENDENT_AMBULATORY_CARE_PROVIDER_SITE_OTHER): Payer: Commercial Managed Care - PPO | Admitting: Neurology

## 2015-06-06 ENCOUNTER — Telehealth: Payer: Self-pay | Admitting: Neurology

## 2015-06-06 DIAGNOSIS — R202 Paresthesia of skin: Secondary | ICD-10-CM | POA: Diagnosis not present

## 2015-06-06 DIAGNOSIS — M542 Cervicalgia: Secondary | ICD-10-CM

## 2015-06-06 NOTE — Telephone Encounter (Signed)
-----   Message from Octaviano Battyebecca S Tat, DO sent at 06/06/2015 11:57 AM EDT ----- Let pt know that no evidence of CTS or cervical radic

## 2015-06-06 NOTE — Procedures (Signed)
[facility]  [address], [address]  [place], [place] [address] Tel: [phone] Fax:  [phone] Test Date:  06/06/2015  Patient: Emily ReamsDeborah Vara DOB: 07/19/1955 Physician: Nita Sickleonika Jelesa Mangini, DO  Sex: Female Height: 5\' 4"  Ref Phys: Kerin Salenebecca Tat, M.D.  ID#: [id number] Temp: 33.0C Technician: Judie PetitM. Dean   Patient Complaints: This is a 60 year old female referred for evaluation of bilateral hand pain and paresthesia.  NCV & EMG Findings: Extensive electrodiagnostic testing of the right upper extremity and additional studies of the left shows: 1. Bilateral median, ulnar, and palmar sensory responses are within normal limits. 2. Bilateral median and ulnar motor responses are within normal limits. 3. There is no evidence of active or chronic motor axonal loss changes affecting any of the tested muscles. Motor unit configuration and recruitment pattern is within normal limits.  Impression: This is a normal study of the upper extremities. In particular, there is no evidence of carpal tunnel syndrome or cervical radiculopathy.   ___________________________ Nita Sickleonika Daylon Lafavor, DO    Nerve Conduction Studies Anti Sensory Summary Table   Stim Site NR Peak (ms) Norm Peak (ms) P-T Amp (V) Norm P-T Amp  Left Median Anti Sensory (2nd Digit)  Wrist    3.0 <3.6 46.5 >15  Right Median Anti Sensory (2nd Digit)  Wrist    3.0 <3.6 46.1 >15  Left Ulnar Anti Sensory (5th Digit)  Wrist    3.0 <3.1 44.7 >10  Right Ulnar Anti Sensory (5th Digit)  Wrist    2.8 <3.1 54.2 >10   Motor Summary Table   Stim Site NR Onset (ms) Norm Onset (ms) O-P Amp (mV) Norm O-P Amp Site1 Site2 Delta-0 (ms) Dist (cm) Vel (m/s) Norm Vel (m/s)  Left Median Motor (Abd Poll Brev)  Wrist    3.5 <4.0 8.7 >6 Elbow Wrist 3.8 22.0 58 >50  Elbow    7.3  8.6         Right Median Motor (Abd Poll Brev)  Wrist    3.3 <4.0 8.4 >6 Elbow Wrist 4.6 27.0 59 >50  Elbow    7.9  8.4         Left Ulnar Motor (Abd Dig Minimi)  Wrist     2.7 <3.1 7.1 >7 B Elbow Wrist 2.8 17.0 61 >50  B Elbow    5.5  6.6  A Elbow B Elbow 1.5 10.0 67 >50  A Elbow    7.0  6.4         Right Ulnar Motor (Abd Dig Minimi)  Wrist    2.3 <3.1 8.7 >7 B Elbow Wrist 3.2 19.0 59 >50  B Elbow    5.5  7.7  A Elbow B Elbow 1.5 10.0 67 >50  A Elbow    7.0  7.2          Comparison Summary Table   Stim Site NR Peak (ms) Norm Peak (ms) P-T Amp (V) Site1 Site2 Delta-P (ms) Norm Delta (ms)  Left Median/Ulnar Palm Comparison (Wrist - 8cm)  Median Palm    1.9 <2.2 138.6 Median Palm Ulnar Palm 0.0   Ulnar Palm    1.9 <2.2 34.0      Right Median/Ulnar Palm Comparison (Wrist - 8cm)  Median Palm    2.0 <2.2 58.6 Median Palm Ulnar Palm 0.0   Ulnar Palm    2.0 <2.2 18.6       EMG   Side Muscle Ins Act Fibs Psw Fasc Number Recrt Dur Dur. Amp Amp. Poly  Poly. Comment  Right 1stDorInt Nml Nml Nml Nml Nml Nml Nml Nml Nml Nml Nml Nml N/A  Right Abd Poll Brev Nml Nml Nml Nml Nml Nml Nml Nml Nml Nml Nml Nml N/A  Right FlexPolLong Nml Nml Nml Nml Nml Nml Nml Nml Nml Nml Nml Nml N/A  Right Ext Indicis Nml Nml Nml Nml Nml Nml Nml Nml Nml Nml Nml Nml N/A  Right PronatorTeres Nml Nml Nml Nml Nml Nml Nml Nml Nml Nml Nml Nml N/A  Right Biceps Nml Nml Nml Nml Nml Nml Nml Nml Nml Nml Nml Nml N/A  Right Triceps Nml Nml Nml Nml Nml Nml Nml Nml Nml Nml Nml Nml N/A  Right Deltoid Nml Nml Nml Nml Nml Nml Nml Nml Nml Nml Nml Nml N/A  Left 1stDorInt Nml Nml Nml Nml Nml Nml Nml Nml Nml Nml Nml Nml N/A  Left Ext Indicis Nml Nml Nml Nml Nml Nml Nml Nml Nml Nml Nml Nml N/A  Left PronatorTeres Nml Nml Nml Nml Nml Nml Nml Nml Nml Nml Nml Nml N/A  Left Biceps Nml Nml Nml Nml Nml Nml Nml Nml Nml Nml Nml Nml N/A  Left Triceps Nml Nml Nml Nml Nml Nml Nml Nml Nml Nml Nml Nml N/A  Left Deltoid Nml Nml Nml Nml Nml Nml Nml Nml Nml Nml Nml Nml N/A      Waveforms:

## 2015-06-06 NOTE — Telephone Encounter (Signed)
Patient made aware.

## 2015-06-07 ENCOUNTER — Other Ambulatory Visit: Payer: Self-pay | Admitting: Family Medicine

## 2015-07-13 ENCOUNTER — Other Ambulatory Visit: Payer: Self-pay

## 2015-07-13 MED ORDER — SUMATRIPTAN SUCCINATE 100 MG PO TABS: ORAL_TABLET | ORAL | Status: AC

## 2015-07-29 ENCOUNTER — Other Ambulatory Visit: Payer: Self-pay | Admitting: Neurosurgery

## 2015-07-29 DIAGNOSIS — M4722 Other spondylosis with radiculopathy, cervical region: Secondary | ICD-10-CM

## 2015-08-05 ENCOUNTER — Ambulatory Visit
Admission: RE | Admit: 2015-08-05 | Discharge: 2015-08-05 | Disposition: A | Discharge status: Home / Self Care | Payer: Commercial Managed Care - PPO | Source: Ambulatory Visit | Attending: Neurosurgery | Admitting: Neurosurgery

## 2015-08-05 DIAGNOSIS — M4722 Other spondylosis with radiculopathy, cervical region: Secondary | ICD-10-CM

## 2016-05-21 ENCOUNTER — Other Ambulatory Visit: Payer: Self-pay | Admitting: Internal Medicine

## 2016-05-21 DIAGNOSIS — Z1231 Encounter for screening mammogram for malignant neoplasm of breast: Secondary | ICD-10-CM

## 2016-06-06 ENCOUNTER — Ambulatory Visit
Admission: RE | Admit: 2016-06-06 | Discharge: 2016-06-06 | Disposition: A | Discharge status: Home / Self Care | Payer: Commercial Managed Care - PPO | Source: Ambulatory Visit | Attending: Internal Medicine | Admitting: Internal Medicine

## 2016-06-06 DIAGNOSIS — Z1231 Encounter for screening mammogram for malignant neoplasm of breast: Secondary | ICD-10-CM

## 2016-08-18 IMAGING — MR MR CERVICAL SPINE W/O CM
5 series · 28 of 48 positions shown · non-contrast
Comparison: Cervical spine radiographs 07/25/2015. Cervical spine
MRI 03/11/2013 at [REDACTED] [HOSPITAL].

CLINICAL DATA: Neck pain extending to the right hand. Numbness in
both hands. Severe posterior headaches.

EXAM:
MRI CERVICAL SPINE WITHOUT CONTRAST
TECHNIQUE: Multiplanar, multisequence MR imaging of the cervical spine was
performed. No intravenous contrast was administered.

[Series 3: T2 · sagittal · 3.0mm · 0.66mm/px · 6 of 12 slices shown (1 of 2)]
[im 1/12]
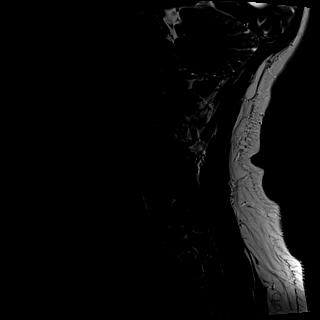
[im 3/12]
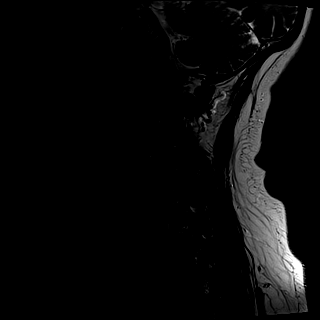
[im 5/12]
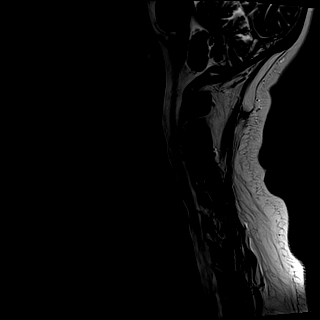
[im 7/12]
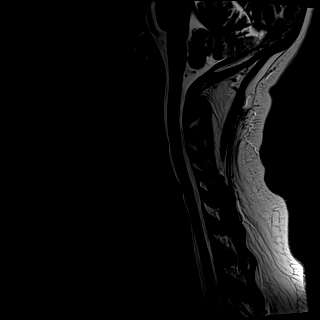
[im 9/12]
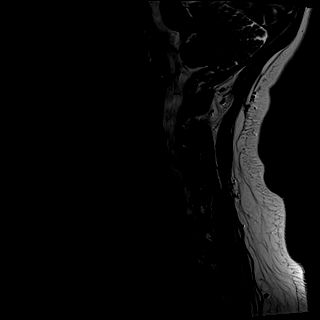
[im 12/12]
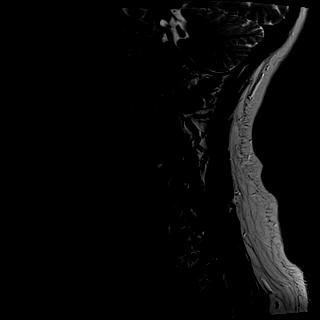

[Series 4: T1 · sagittal · 3.0mm · 0.41mm/px · 6 of 12 slices shown]
[im 1/12]
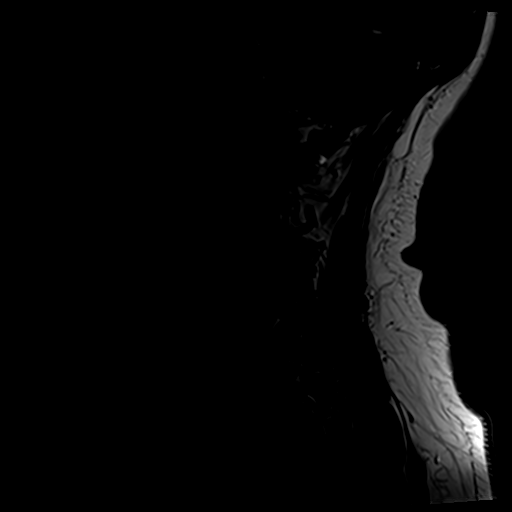
[im 3/12]
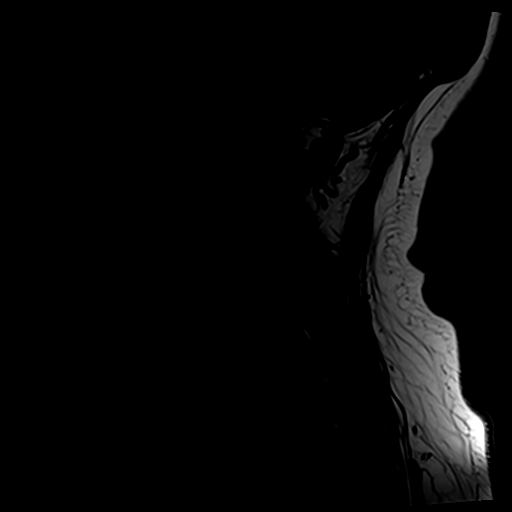
[im 5/12]
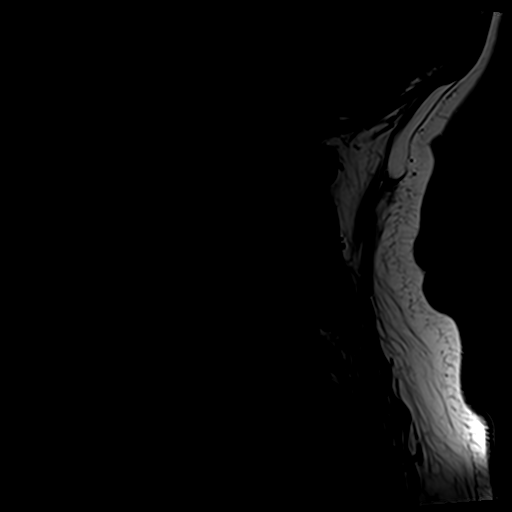
[im 7/12]
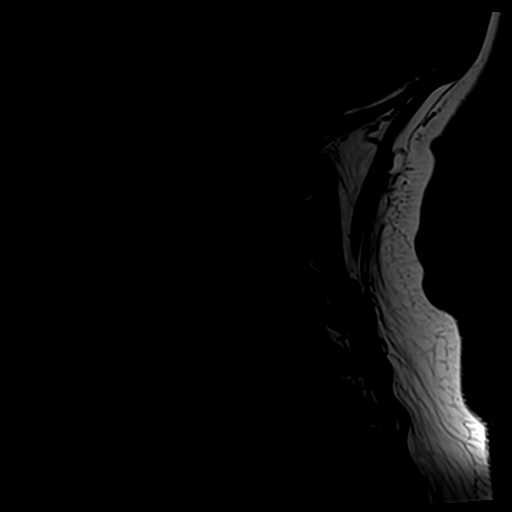
[im 9/12]
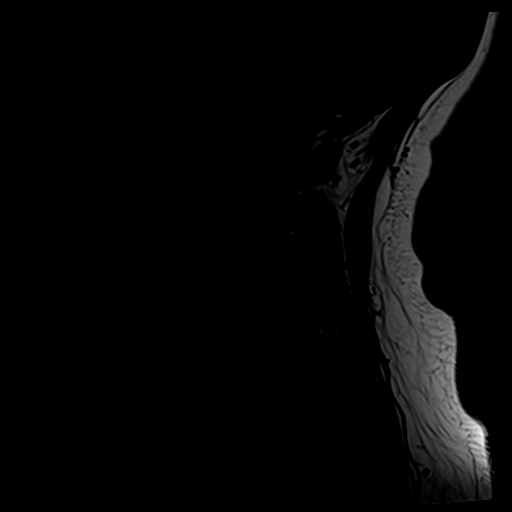
[im 12/12]
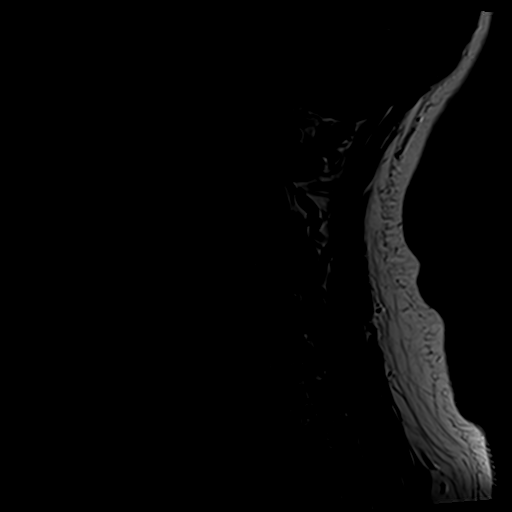

[Series 5: tir sag · sagittal · 3.0mm · 0.41mm/px · 6 of 12 slices shown]
[im 1/12]
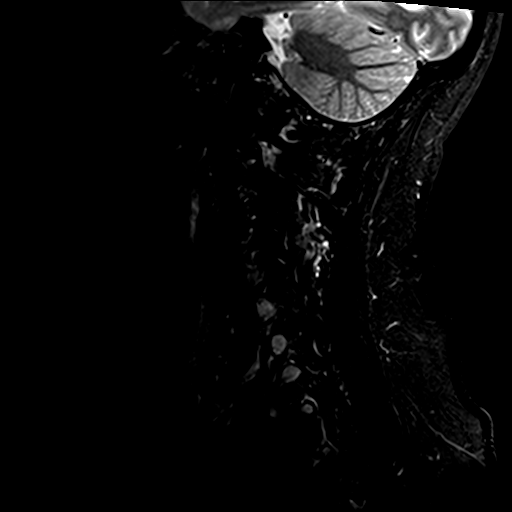
[im 3/12]
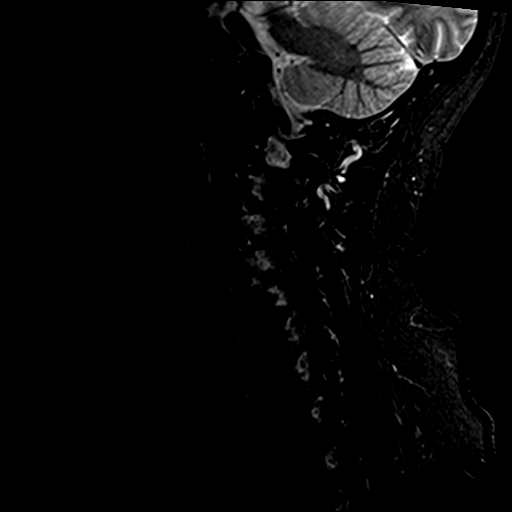
[im 5/12]
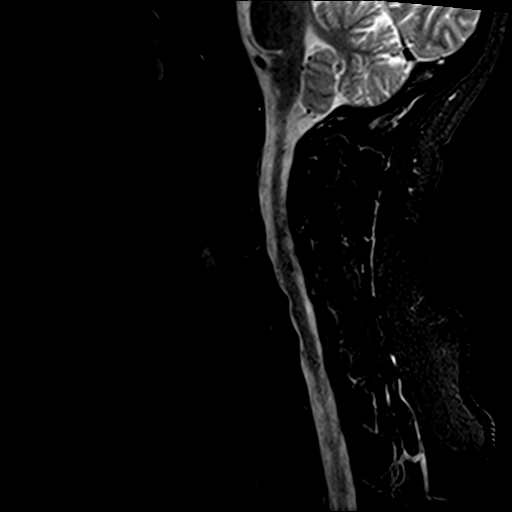
[im 7/12]
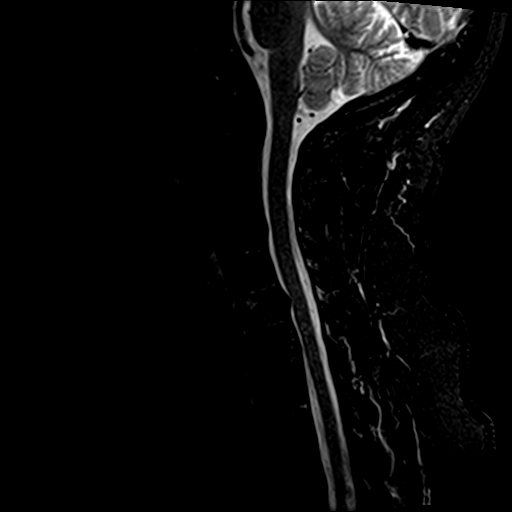
[im 9/12]
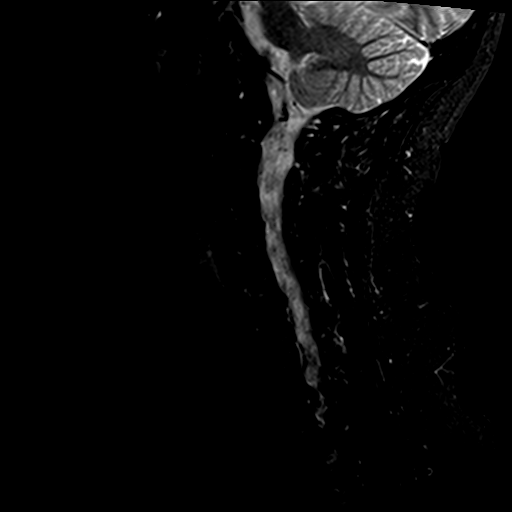
[im 12/12]
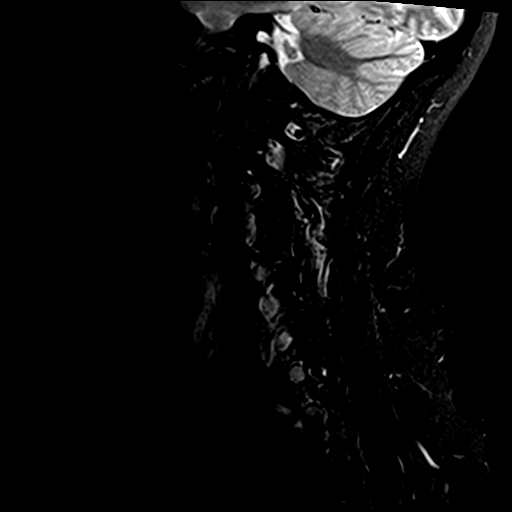

[Series 6: GRE · axial · 3.0mm · 0.35mm/px · 1 of 28 slices shown]
[im 1/28]
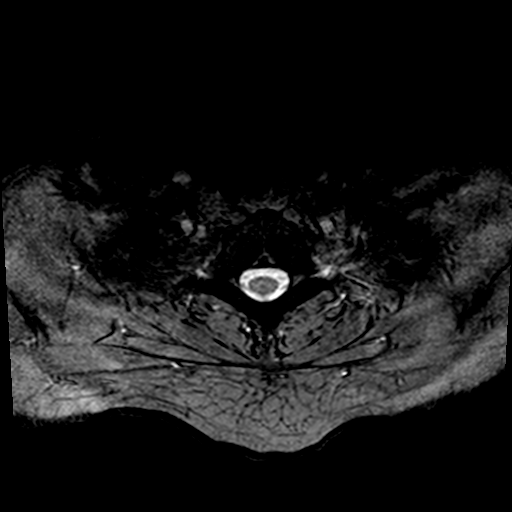

[Series 7: T2 · axial · 3.0mm · 0.70mm/px · z∈[-60,+39]mm · 9 of 28 slices shown (2 of 2)]
[im 1/28]
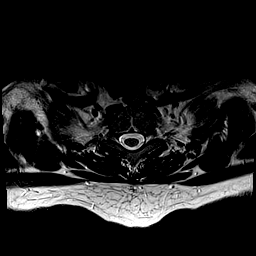
[im 4/28]
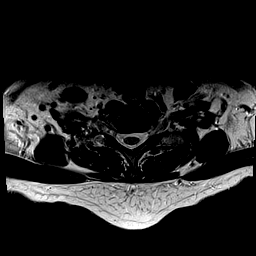
[im 8/28]
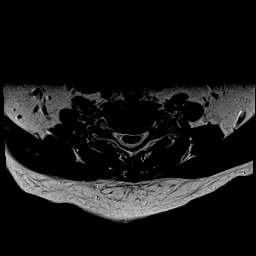
[im 12/28]
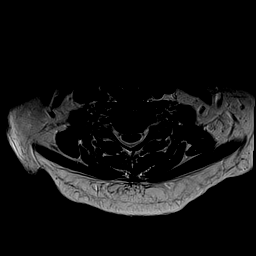
[im 14/28]
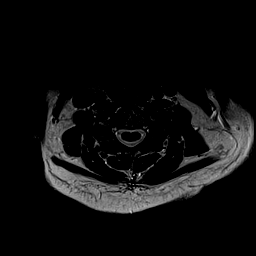
[im 16/28]
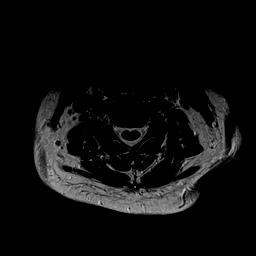
[im 20/28]
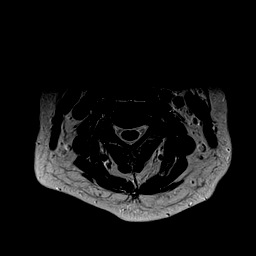
[im 24/28]
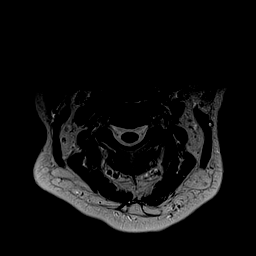
[im 28/28]
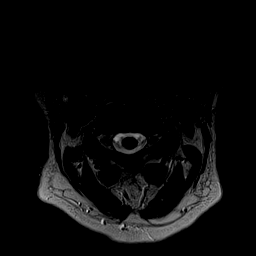

[28 of 48 positions shown; findings below may reference images not displayed]

FINDINGS: Alignment: AP alignment is anatomic.

Vertebrae: A sclerotic lesion at the inferior endplate of C[DATE] be
degenerative. Chronic endplate degenerative changes are most noted
at C6-7 and to lesser extent at C5-6.

Cord: Normal signal is present in the cervical and upper thoracic
spinal cord to the lowest imaged level, T2-3.

Posterior Fossa, vertebral arteries, paraspinal tissues: The
craniocervical junction is within normal limits. The visualized
intracranial contents are normal.

Disc levels:

C2-3:  Negative.

C3-4: Mild uncovertebral and facet disease is present on the right.
There is mild right foraminal narrowing. This is stable.

C4-5: Mild foraminal narrowing bilaterally is due to uncovertebral
disease without significant interval change.

C5-6: A broad-based disc osteophyte complex effaces the ventral CSF.
Mild central and bilateral foraminal narrowing is stable.

C6-7: A leftward disc osteophyte complex is present. Mild central
canal narrowing is stable. Moderate foraminal stenosis is stable,
left greater than right.

C7-T1: A rightward disc protrusion is present. There is mild right
foraminal narrowing without significant interval change.
IMPRESSION: 1. Stable multilevel spondylosis of the cervical spine.
2. Mild foraminal narrowing bilaterally at C4-5 and C5-6.
3. Mild central canal stenosis at C5-6 and C6-7.
4. Moderate foraminal stenosis at C6-7 is the most significant
cervical disease. This is worse on the left.
5. Mild right foraminal narrowing at both C3-4 and C7-T1.

## 2016-12-04 ENCOUNTER — Other Ambulatory Visit: Payer: Self-pay | Admitting: Obstetrics and Gynecology

## 2016-12-04 DIAGNOSIS — N6002 Solitary cyst of left breast: Secondary | ICD-10-CM

## 2016-12-04 DIAGNOSIS — N63 Unspecified lump in unspecified breast: Secondary | ICD-10-CM

## 2016-12-05 ENCOUNTER — Ambulatory Visit
Admission: RE | Admit: 2016-12-05 | Discharge: 2016-12-05 | Disposition: A | Discharge status: Home / Self Care | Payer: PRIVATE HEALTH INSURANCE | Source: Ambulatory Visit | Attending: Obstetrics and Gynecology | Admitting: Obstetrics and Gynecology

## 2016-12-05 ENCOUNTER — Ambulatory Visit
Admission: RE | Admit: 2016-12-05 | Discharge: 2016-12-05 | Disposition: A | Discharge status: Home / Self Care | Payer: Commercial Managed Care - PPO | Source: Ambulatory Visit | Attending: Obstetrics and Gynecology | Admitting: Obstetrics and Gynecology

## 2016-12-05 DIAGNOSIS — N63 Unspecified lump in unspecified breast: Secondary | ICD-10-CM

## 2017-05-26 ENCOUNTER — Other Ambulatory Visit: Payer: Self-pay | Admitting: Obstetrics and Gynecology

## 2017-05-26 DIAGNOSIS — Z1231 Encounter for screening mammogram for malignant neoplasm of breast: Secondary | ICD-10-CM

## 2017-06-20 ENCOUNTER — Ambulatory Visit
Admission: RE | Admit: 2017-06-20 | Discharge: 2017-06-20 | Disposition: A | Discharge status: Home / Self Care | Payer: PRIVATE HEALTH INSURANCE | Source: Ambulatory Visit | Attending: Obstetrics and Gynecology | Admitting: Obstetrics and Gynecology

## 2017-06-20 DIAGNOSIS — Z1231 Encounter for screening mammogram for malignant neoplasm of breast: Secondary | ICD-10-CM

## 2018-06-05 ENCOUNTER — Emergency Department (HOSPITAL_COMMUNITY)
Admission: EM | Admit: 2018-06-05 | Discharge: 2018-06-06 | Disposition: A | Discharge status: Home / Self Care | Payer: BLUE CROSS/BLUE SHIELD | Attending: Emergency Medicine | Admitting: Emergency Medicine

## 2018-06-05 ENCOUNTER — Other Ambulatory Visit: Payer: Self-pay

## 2018-06-05 ENCOUNTER — Encounter (HOSPITAL_COMMUNITY): Payer: Self-pay | Admitting: Emergency Medicine

## 2018-06-05 DIAGNOSIS — Z79899 Other long term (current) drug therapy: Secondary | ICD-10-CM | POA: Diagnosis not present

## 2018-06-05 DIAGNOSIS — I10 Essential (primary) hypertension: Secondary | ICD-10-CM | POA: Insufficient documentation

## 2018-06-05 DIAGNOSIS — R5381 Other malaise: Secondary | ICD-10-CM | POA: Diagnosis present

## 2018-06-05 DIAGNOSIS — T407X1A Poisoning by cannabis (derivatives), accidental (unintentional), initial encounter: Secondary | ICD-10-CM | POA: Insufficient documentation

## 2018-06-05 DIAGNOSIS — Z87891 Personal history of nicotine dependence: Secondary | ICD-10-CM | POA: Diagnosis not present

## 2018-06-05 DIAGNOSIS — T40711A Poisoning by cannabis, accidental (unintentional), initial encounter: Secondary | ICD-10-CM

## 2018-06-05 MED ORDER — SODIUM CHLORIDE 0.9 % IV BOLUS
500.0000 mL | Freq: Once | INTRAVENOUS | Status: AC
  Administered 2018-06-06: 500 mL via INTRAVENOUS

## 2018-06-05 NOTE — ED Provider Notes (Signed)
[facility]-EMERGENCY DEPT Provider Note   CSN: [id number] Arrival date & time: 06/05/18  2309    History   Chief Complaint Chief Complaint  Patient presents with  . Medication Reaction    HPI Emily Fowler is a 63 y.o. female with a hx of depression, HTN, hyperlipidemia, migraine headache presents to the Emergency Department complaining of gradual, persistent, progressively worsening "feeling strange" onset approx 3 hours ago.  Pt reports this happened approx 1 hour after eating a "pot brownie."  Associated symptoms include lightheadedness, sweating, palpitations, chest pressure, shaking, and feeling anxious.  Pt reports taking 1/2 tab of xanax approx 1.5 hours ago that did not help her symptoms.  Pt reports she has recently started smoking marijuana with approx 3 episodes in the last few weeks.  She denies symptoms like this in the past with marijuana usage, but has never tried edibles.  Pt reports she became more anxious at home, thus presenting here for evaluation. She denies fever, chills, headache, neck pain, chest pain, SOB, N/V/D, weakness, syncope, dysuria, hematuria.  Pt reports her father died from a sudden cardiac arrest at the age of 41.  She reports neg stress test in the past.      The history is provided by the patient and medical records. No language interpreter was used.    Past Medical History:  Diagnosis Date  . Abnormal Pap smear of cervix   . Blood in stool    evaluated with GI in 2014 with EGD and colonoscopy and had hemorroids, told repeat colonoscoy in 10 years  . Colon polyps   . Depression   . Fatty liver    NASH with normal LFT's  . Frequent headaches   . Hyperlipidemia   . Hypertension   . Migraines   . Palpitations - evaluated by cardiologist in 2015 06/18/2013    Patient Active Problem List   Diagnosis Date Noted  . DDD (degenerative disc disease), lumbar, s/p surgery remotely - followed by [facility]  07/23/2013  . Palpitations - evaluated by cardiologist in 2015 06/18/2013  . Hypertension 06/18/2013  . Dyslipidemia 06/18/2013  . Prediabetes 06/18/2013  . Chronic neck pain - DDD 06/18/2013  . Migraines 06/18/2013    Past Surgical History:  Procedure Laterality Date  . APPENDECTOMY    . LUMBAR DISC ARTHROPLASTY    . LYMPH NODE BIOPSY     in 2015, evaluated by ENT for abnormal lymph node in neck  . TONSILLECTOMY       OB History    Gravida  3   Para  3   Term      Preterm      AB      Living        SAB      TAB      Ectopic      Multiple      Live Births               Home Medications    Prior to Admission medications   Medication Sig Start Date End Date Taking? Authorizing Provider  ALPRAZolam Prudy Feeler) 0.5 MG tablet Take 0.25 mg by mouth at bedtime as needed for anxiety.   Yes [provider]  ibuprofen (ADVIL) 200 MG tablet Take 200 mg by mouth every 6 (six) hours as needed for moderate pain.   Yes [provider]  lisinopril (PRINIVIL,ZESTRIL) 10 MG tablet TAKE 1 TABLET EVERY DAY Patient taking differently: Take 10 mg  by mouth daily.  06/01/15  Yes Terressa KoyanagiKim, Remiel Corti R, DO  Multiple Vitamins-Minerals (MULTIVITAMIN PO) Take 1 tablet by mouth daily.    Yes [provider]  Probiotic Product (ALIGN PO) Take 1 tablet by mouth daily.    Yes [provider]  sucralfate (CARAFATE) 1 g tablet Take 1 g by mouth daily.  05/20/18  Yes [provider]  SUMAtriptan (IMITREX) 100 MG tablet TAKE 1 TABLET EVERY DAY AS NEEDED FOR MIGRAINE HEADACHE Patient taking differently: Take 100 mg by mouth every 2 (two) hours as needed for migraine.  07/13/15  Yes Burchette, Elberta FortisBruce W, MD  venlafaxine (EFFEXOR) 37.5 MG tablet TAKE 1 TABLET BY MOUTH EVERY DAY Patient taking differently: Take 37.5 mg by mouth daily.  10/26/14  Yes Terressa KoyanagiKim, Ayona Yniguez R, DO    Family History Family History  Problem Relation Age of Onset  . Heart disease Father   .  Hyperlipidemia Father   . Hypertension Father   . Diabetes Mother   . Hyperlipidemia Mother   . Hypertension Mother   . Mental illness Brother   . Cancer Maternal Aunt        lymphoma  . Cancer Maternal Grandfather        prostate  . Diabetes Paternal Grandmother   . Ovarian cancer Other        sister    Social History Social History   Tobacco Use  . Smoking status: Former Smoker    Last attempt to quit: 01/08/2012    Years since quitting: 6.4  . Smokeless tobacco: Never Used  . Tobacco comment: 1-2 cigs for 10 years, quit in 2014  Substance Use Topics  . Alcohol use: Yes    Alcohol/week: 0.0 standard drinks    Comment: couple drinks on weekend  . Drug use: No     Allergies   Prozac [fluoxetine hcl]   Review of Systems Review of Systems  Constitutional: Positive for diaphoresis. Negative for appetite change, fatigue, fever and unexpected weight change.  HENT: Negative for mouth sores.   Eyes: Negative for visual disturbance.  Respiratory: Positive for chest tightness. Negative for cough, shortness of breath and wheezing.   Cardiovascular: Negative for chest pain.  Gastrointestinal: Negative for abdominal pain, constipation, diarrhea, nausea and vomiting.  Endocrine: Negative for polydipsia, polyphagia and polyuria.  Genitourinary: Negative for dysuria, frequency, hematuria and urgency.  Musculoskeletal: Negative for back pain and neck stiffness.  Skin: Negative for rash.  Allergic/Immunologic: Negative for immunocompromised state.  Neurological: Positive for tremors. Negative for syncope, light-headedness and headaches.  Hematological: Does not bruise/bleed easily.  Psychiatric/Behavioral: Negative for sleep disturbance. The patient is nervous/anxious.      Physical Exam Updated Vital Signs BP (!) 168/99 (BP Location: Left Arm)   Pulse 87   Temp 98 F (36.7 C) (Oral)   Resp 16   SpO2 99%   Physical Exam Vitals signs and nursing note reviewed.   Constitutional:      General: She is not in acute distress.    Appearance: She is not diaphoretic.  HENT:     Head: Normocephalic.  Eyes:     General: No scleral icterus.    Conjunctiva/sclera: Conjunctivae normal.  Neck:     Musculoskeletal: Normal range of motion.  Cardiovascular:     Rate and Rhythm: Normal rate and regular rhythm.     Pulses: Normal pulses.          Radial pulses are 2+ on the right side and 2+ on the  left side.       Posterior tibial pulses are 2+ on the right side and 2+ on the left side.  Pulmonary:     Effort: No tachypnea, accessory muscle usage, prolonged expiration, respiratory distress or retractions.     Breath sounds: No stridor.     Comments: Equal chest rise. No increased work of breathing. Abdominal:     General: There is no distension.     Palpations: Abdomen is soft.     Tenderness: There is no abdominal tenderness. There is no guarding or rebound.  Musculoskeletal:     Comments: Moves all extremities equally and without difficulty.  Skin:    General: Skin is warm and dry.     Capillary Refill: Capillary refill takes less than 2 seconds.  Neurological:     Mental Status: She is alert.     GCS: GCS eye subscore is 4. GCS verbal subscore is 5. GCS motor subscore is 6.     Comments: Speech is clear and goal oriented.  Psychiatric:        Mood and Affect: Mood normal.      ED Treatments / Results  Labs (all labs ordered are listed, but only abnormal results are displayed) Labs Reviewed  COMPREHENSIVE METABOLIC PANEL - Abnormal; Notable for the following components:      Result Value   Glucose, Bld 139 (*)    All other components within normal limits  RAPID URINE DRUG SCREEN, HOSP PERFORMED - Abnormal; Notable for the following components:   Tetrahydrocannabinol POSITIVE (*)    All other components within normal limits  CBC WITH DIFFERENTIAL/PLATELET  TROPONIN I    EKG EKG Interpretation  Date/Time:  Saturday Jun 06 2018  00:12:27 EDT Ventricular Rate:  77 PR Interval:    QRS Duration: 85 QT Interval:  393 QTC Calculation: 445 R Axis:   54 Text Interpretation:  Sinus rhythm Normal ECG No previous ECGs available Confirmed by Paula Libra ([id number]) on 06/06/2018 12:29:29 AM   Procedures Procedures (including critical care time)  Medications Ordered in ED Medications  sodium chloride 0.9 % bolus 500 mL (0 mLs Intravenous Stopped 06/06/18 0118)     Initial Impression / Assessment and Plan / ED Course  I have reviewed the triage vital signs and the nursing notes.  Pertinent labs & imaging results that were available during my care of the patient were reviewed by me and considered in my medical decision making (see chart for details).  Clinical Course as of Jun 05 252  Sat Jun 06, 2018  0217 Tetrahydrocannabinol(!): POSITIVE [HM]  0217 normal  Troponin I: <0.03 [HM]  0217 NSR; no ischemia  EKG 12-Lead [HM]  0217 No hypoglycemia  Glucose(!): 139 [HM]  0218 HTN on arrival  BP(!): 168/99 [HM]    Clinical Course User Index [HM] Jocsan Mcginley, Dahlia Client, PA-C        Pt presents with adverse reaction to marijuana edibles. Suspect overdose as patient is anxious with chest pressure, hypertension, tremors.  She is overall well-appearing.  Labs are reassuring.  UDS positive for THC as expected.  No other drugs noted.  Less likely to have had known ingestion of other drug.  Troponin is negative and EKG is without ischemia.  2:54 AM Patient feels back to her normal self.  She has no persistent chest pressure.  No shortness of breath or chest pain.  She is ambulated here in the emergency department without dyspnea on exertion or chest pain or pressure.  Patient's  blood pressure has improved significantly (now 124/90).  At this time I do not believe she has a medical emergency which requires intervention.  She is stable for discharge home.  Discussed marijuana usage and specifically edibles.  Discussed reasons to  return immediately to the emergency department.  Patient states understanding and is in agreement with the plan.   Final Clinical Impressions(s) / ED Diagnoses   Final diagnoses:  Accidental marijuana overdose, initial encounter    ED Discharge Orders    None       Mardene Sayer Boyd Kerbs 06/06/18 0254    Molpus, Jonny Ruiz, MD 06/06/18 [phone]

## 2018-06-05 NOTE — ED Triage Notes (Signed)
After eating Pot brownie pt began feeling strange and felt like she was coming out of her skin.

## 2018-06-06 LAB — RAPID URINE DRUG SCREEN, HOSP PERFORMED
Amphetamines: NOT DETECTED
Barbiturates: NOT DETECTED
Benzodiazepines: NOT DETECTED
Cocaine: NOT DETECTED
Opiates: NOT DETECTED
Tetrahydrocannabinol: POSITIVE — AB

## 2018-06-06 LAB — CBC WITH DIFFERENTIAL/PLATELET
Abs Immature Granulocytes: 0.03 10*3/uL (ref 0.00–0.07)
Basophils Absolute: 0 10*3/uL (ref 0.0–0.1)
Basophils Relative: 0 %
Eosinophils Absolute: 0.2 10*3/uL (ref 0.0–0.5)
Eosinophils Relative: 3 %
HCT: 43.9 % (ref 36.0–46.0)
Hemoglobin: 14.4 g/dL (ref 12.0–15.0)
Immature Granulocytes: 0 %
Lymphocytes Relative: 32 %
Lymphs Abs: 2.2 10*3/uL (ref 0.7–4.0)
MCH: 30 pg (ref 26.0–34.0)
MCHC: 32.8 g/dL (ref 30.0–36.0)
MCV: 91.5 fL (ref 80.0–100.0)
Monocytes Absolute: 0.7 10*3/uL (ref 0.1–1.0)
Monocytes Relative: 11 %
Neutro Abs: 3.7 10*3/uL (ref 1.7–7.7)
Neutrophils Relative %: 54 %
Platelets: 239 10*3/uL (ref 150–400)
RBC: 4.8 MIL/uL (ref 3.87–5.11)
RDW: 12.6 % (ref 11.5–15.5)
WBC: 6.8 10*3/uL (ref 4.0–10.5)
nRBC: 0 % (ref 0.0–0.2)

## 2018-06-06 LAB — COMPREHENSIVE METABOLIC PANEL
ALT: 34 U/L (ref 0–44)
AST: 27 U/L (ref 15–41)
Albumin: 4.3 g/dL (ref 3.5–5.0)
Alkaline Phosphatase: 96 U/L (ref 38–126)
Anion gap: 10 (ref 5–15)
BUN: 16 mg/dL (ref 8–23)
CO2: 26 mmol/L (ref 22–32)
Calcium: 9.3 mg/dL (ref 8.9–10.3)
Chloride: 104 mmol/L (ref 98–111)
Creatinine, Ser: 0.8 mg/dL (ref 0.44–1.00)
GFR calc Af Amer: >60 mL/min (ref >60)
GFR calc non Af Amer: >60 mL/min (ref >60)
Glucose, Bld: 139 mg/dL — ABNORMAL HIGH (ref 70–99)
Potassium: 4.4 mmol/L (ref 3.5–5.1)
Sodium: 140 mmol/L (ref 135–145)
Total Bilirubin: 0.3 mg/dL (ref 0.3–1.2)
Total Protein: 7.4 g/dL (ref 6.5–8.1)

## 2018-06-06 LAB — TROPONIN I: Troponin I: <0.03 ng/mL (ref <0.03)

## 2018-06-06 NOTE — Discharge Instructions (Addendum)
1. Medications:  usual home medications 2. Treatment: rest, drink plenty of fluids,  3. Follow Up: Please followup with your primary doctor in 2-3 days for discussion of your diagnoses and further evaluation after today's visit; if you do not have a primary care doctor use the resource guide provided to find one; Please return to the ER for new or worsening symptoms including chest pain, shortness of breath, weakness, confusion, vision changes or other concerns

## 2018-11-03 ENCOUNTER — Other Ambulatory Visit: Payer: Self-pay | Admitting: Family Medicine

## 2018-11-03 DIAGNOSIS — Z1231 Encounter for screening mammogram for malignant neoplasm of breast: Secondary | ICD-10-CM

## 2018-11-06 ENCOUNTER — Other Ambulatory Visit: Payer: Self-pay

## 2018-11-06 ENCOUNTER — Ambulatory Visit
Admission: RE | Admit: 2018-11-06 | Discharge: 2018-11-06 | Disposition: A | Discharge status: Home / Self Care | Payer: BC Managed Care – PPO | Source: Ambulatory Visit | Attending: Family Medicine | Admitting: Family Medicine

## 2018-11-06 DIAGNOSIS — Z1231 Encounter for screening mammogram for malignant neoplasm of breast: Secondary | ICD-10-CM

## 2018-12-24 ENCOUNTER — Telehealth: Payer: Self-pay | Admitting: *Deleted

## 2018-12-24 ENCOUNTER — Other Ambulatory Visit: Payer: Self-pay | Admitting: *Deleted

## 2018-12-24 DIAGNOSIS — R002 Palpitations: Secondary | ICD-10-CM

## 2018-12-24 NOTE — Telephone Encounter (Signed)
3 day ZIO XT long term holter monitor to be mailed to the patients home.  Instructions reviewed briefly as they are included in the monitor kit. 

## 2018-12-30 ENCOUNTER — Ambulatory Visit (INDEPENDENT_AMBULATORY_CARE_PROVIDER_SITE_OTHER): Payer: BC Managed Care – PPO

## 2018-12-30 DIAGNOSIS — R002 Palpitations: Secondary | ICD-10-CM | POA: Diagnosis not present

## 2019-09-08 ENCOUNTER — Ambulatory Visit (INDEPENDENT_AMBULATORY_CARE_PROVIDER_SITE_OTHER): Payer: 59 | Admitting: Psychiatry

## 2019-09-08 ENCOUNTER — Other Ambulatory Visit: Payer: Self-pay

## 2019-09-08 ENCOUNTER — Encounter (HOSPITAL_COMMUNITY): Payer: Self-pay | Admitting: Psychiatry

## 2019-09-08 DIAGNOSIS — F3342 Major depressive disorder, recurrent, in full remission: Secondary | ICD-10-CM | POA: Diagnosis not present

## 2019-09-08 DIAGNOSIS — F419 Anxiety disorder, unspecified: Secondary | ICD-10-CM | POA: Diagnosis not present

## 2019-09-08 MED ORDER — LORAZEPAM 0.5 MG PO TABS: 0.5000 mg | ORAL_TABLET | Freq: Two times a day (BID) | ORAL | 1 refills | Status: DC | PRN

## 2019-09-08 MED ORDER — VENLAFAXINE HCL ER 37.5 MG PO CP24: 37.5000 mg | ORAL_CAPSULE | Freq: Every day | ORAL | 0 refills | Status: DC

## 2019-09-08 NOTE — Progress Notes (Signed)
Psychiatric Initial Adult Assessment   Patient Identification: Emily Fowler MRN:  [id number] Date of Evaluation:  09/08/2019   Referral Source: Self  Chief Complaint:   " I have been feeling anxious."  Visit Diagnosis:    ICD-10-CM   1. Anxiety  F41.9   2. MDD (major depressive disorder), recurrent, in full remission (HCC)  F33.42     History of Present Illness:  This is a 64 year old female with history of MDD and anxiety now seen for evaluation after she presented as a walk-in.  Patient reported that she has been managed by her primary care provider for the past 20 years and has been on Effexor XR 37.5 mg for quite some time.  She used to be on a higher dose of 75 mg in the past.  She informed that she has not seen a psychiatrist in the last 20 years. She reported that her depression symptoms have been stable for a while however for the past few weeks she has been feeling increasingly anxious.  She reported that her daughter is expecting her first baby and this would be patient's first grandchild.  She is driving to be with her daughter who is expected to deliver tomorrow evening in [place]. She informed that over the past 3 days she has been feeling extremely anxious and couple of nights ago she had a panic attack when her body started trembling and she felt warm rush of feeling going through her legs into her arms and head.  She stated that she had significant palpitations and increased sweating. She informed that the only reason that probably is triggering all this is because when 33 years ago she had her first baby she was living in [place] with her husband.  At that time her parents had visited her from [place] and after they had left a return back home the very next day she got a phone call that her father had passed away unexpectedly due to a sudden MI. Patient stated that she never really processed this completely.  She recalls having to travel with a newborn baby to the [facility] and having to deal with the fact that she had lost her father all of a sudden.  She reported that she managed fairly well after that however lately with her daughter being pregnant and having some complications due to preeclampsia over the last 3 months the past painful memories have resurfaced.  Patient informed that 2 days ago when she had a bad panic attack she called her PCP and is sent 3 days worth of Ativan 0.5 mg strength which she took and found to be very helpful.  She reluctantly informed that her significant other had some Xanax tablets and she used half of that as well and she called her other provider and they advised her to take the remaining half as well for optimal effect.  Patient denied any symptom suggestive of mania or hypomania.  She denied any psychotic symptoms.  She denied any flashbacks or nightmares.  Patient denied any excessive consumption of alcohol.  She stated that she had a very bad experience with marijuana and she used it once and that resulted in an ED visit and she has no intention to use it ever again.  She informed that she works as an Print production planner at [employer].  She is planning to cut down her hours and hoping to retire over the next 1 to 2 years.  Past Psychiatric History: Depression, Anxiety.  Patient  reported that she had some anxiety in her late teens and early 3420s.  She had 1 panic attack may be in her early 2320s but never on a daily basis.  She has been on Effexor for a while.  She took Prozac in the past but that caused her to have horrible side effects.  Previous Psychotropic Medications: Yes   Substance Abuse History in the last 12 months:  No.  Consequences of Substance Abuse: NA  Past Medical History:  Past Medical History:  Diagnosis Date  . Abnormal Pap smear of cervix   . Blood in stool    evaluated with GI in 2014 with EGD and colonoscopy and had hemorroids, told repeat colonoscoy in 10 years  . Colon polyps   .  Depression   . Fatty liver    NASH with normal LFT's  . Frequent headaches   . Hyperlipidemia   . Hypertension   . Migraines   . Palpitations - evaluated by cardiologist in 2015 06/18/2013    Past Surgical History:  Procedure Laterality Date  . APPENDECTOMY    . LUMBAR DISC ARTHROPLASTY    . LYMPH NODE BIOPSY     in 2015, evaluated by ENT for abnormal lymph node in neck  . TONSILLECTOMY      Family Psychiatric History: Brother- Bipolar d/o, panic d/o  Family History:  Family History  Problem Relation Age of Onset  . Heart disease Father   . Hyperlipidemia Father   . Hypertension Father   . Diabetes Mother   . Hyperlipidemia Mother   . Hypertension Mother   . Mental illness Brother   . Cancer Maternal Aunt        lymphoma  . Cancer Maternal Grandfather        prostate  . Diabetes Paternal Grandmother   . Ovarian cancer Other        sister    Social History:   Social History   Socioeconomic History  . Marital status: Divorced    Spouse name: Not on file  . Number of children: Not on file  . Years of education: Not on file  . Highest education level: Not on file  Occupational History  . Occupation: ultrasound tech    Comment: [employer]  Tobacco Use  . Smoking status: Former Smoker    Quit date: 01/08/2012    Years since quitting: 7.6  . Smokeless tobacco: Never Used  . Tobacco comment: 1-2 cigs for 10 years, quit in 2014  Substance and Sexual Activity  . Alcohol use: Yes    Alcohol/week: 0.0 standard drinks    Comment: couple drinks on weekend  . Drug use: No  . Sexual activity: Not on file  Other Topics Concern  . Not on file  Social History Narrative   Work or School: [employer] - parttime      Home Situation: living with partner - female partner; three great kids - all out of house law, Museum/gallery curatorchemistry and engineering      Spiritual Beliefs: spiritual person, does not attend church, raised catholic      Lifestyle: walking 3 days per week; diet is  healthy            Social Determinants of Corporate investment bankerHealth   Financial Resource Strain:   . Difficulty of Paying Living Expenses: Not on file  Food Insecurity:   . Worried About Programme researcher, broadcasting/film/videounning Out of Food in the Last Year: Not on file  . Ran Out of Food in the  Last Year: Not on file  Transportation Needs:   . Lack of Transportation (Medical): Not on file  . Lack of Transportation (Non-Medical): Not on file  Physical Activity:   . Days of Exercise per Week: Not on file  . Minutes of Exercise per Session: Not on file  Stress:   . Feeling of Stress : Not on file  Social Connections:   . Frequency of Communication with Friends and Family: Not on file  . Frequency of Social Gatherings with Friends and Family: Not on file  . Attends Religious Services: Not on file  . Active Member of Clubs or Organizations: Not on file  . Attends Banker Meetings: Not on file  . Marital Status: Not on file    Additional Social History: Currently lives with partner, works as Psychologist, educational at [employer] Divorced, has 3 children  Allergies:   Allergies  Allergen Reactions  . Prozac [Fluoxetine Hcl] Diarrhea and Other (See Comments)    "Felt like losing my mind"    Metabolic Disorder Labs: Lab Results  Component Value Date   HGBA1C 5.8 01/10/2014   No results found for: PROLACTIN Lab Results  Component Value Date   CHOL 222 (H) 05/16/2014   TRIG 174.0 (H) 05/16/2014   HDL 59.70 05/16/2014   CHOLHDL 4 05/16/2014   VLDL 34.8 05/16/2014   LDLCALC 128 (H) 05/16/2014   LDLCALC 112 (H) 06/18/2013   Lab Results  Component Value Date   TSH 1.32 01/10/2014    Therapeutic Level Labs: No results found for: LITHIUM No results found for: CBMZ No results found for: VALPROATE  Current Medications: Current Outpatient Medications  Medication Sig Dispense Refill  . venlafaxine XR (EFFEXOR-XR) 37.5 MG 24 hr capsule Take 37.5 mg by mouth daily with breakfast.    . ibuprofen (ADVIL) 200 MG  tablet Take 200 mg by mouth every 6 (six) hours as needed for moderate pain.    Marland Kitchen lisinopril (PRINIVIL,ZESTRIL) 10 MG tablet TAKE 1 TABLET EVERY DAY (Patient taking differently: Take 10 mg by mouth daily. ) 90 tablet 0  . Multiple Vitamins-Minerals (MULTIVITAMIN PO) Take 1 tablet by mouth daily.     . Probiotic Product (ALIGN PO) Take 1 tablet by mouth daily.     . sucralfate (CARAFATE) 1 g tablet Take 1 g by mouth daily.     . SUMAtriptan (IMITREX) 100 MG tablet TAKE 1 TABLET EVERY DAY AS NEEDED FOR MIGRAINE HEADACHE (Patient taking differently: Take 100 mg by mouth every 2 (two) hours as needed for migraine. ) 9 tablet 0   No current facility-administered medications for this visit.    Musculoskeletal: Strength & Muscle Tone: within normal limits Gait & Station: normal Patient leans: N/A  Psychiatric Specialty Exam: Review of Systems  There were no vitals taken for this visit.There is no height or weight on file to calculate BMI.  General Appearance: Fairly Groomed  Eye Contact:  Good  Speech:  Clear and Coherent and Normal Rate  Volume:  Normal  Mood:  Anxious  Affect:  Congruent  Thought Process:  Goal Directed and Descriptions of Associations: Intact  Orientation:  Full (Time, Place, and Person)  Thought Content:  Logical  Suicidal Thoughts:  No  Homicidal Thoughts:  No  Memory:  Immediate;   Good Recent;   Good  Judgement:  Fair  Insight:  Fair  Psychomotor Activity:  Normal  Concentration:  Concentration: Good and Attention Span: Good  Recall:  Good  Fund of Knowledge:Good  Language: Good  Akathisia:  Negative  Handed:  Right  AIMS (if indicated):  Not done  Assets:  Communication Skills Desire for Improvement Financial Resources/Insurance Housing  ADL's:  Intact  Cognition: WNL  Sleep:  Fair     Assessment and Plan: Patient appears to be dealing with significant anxiety surrounding the birth of her first grandchild as it has triggered memories of unfortunate  events that happened around delivery of her own first child.  Patient responded well to Ativan and based on her PDMP review and past records from EMR she does not have any history of substance abuse issues.  Patient is agreeable to having Ativan 0.5 mg twice daily for as needed use for breakthrough anxiety.  Recommended to continue Effexor XR at current dose.  1. MDD (major depressive disorder), recurrent, in full remission (HCC)  - Continue venlafaxine XR (EFFEXOR-XR) 37.5 MG 24 hr capsule; Take 1 capsule (37.5 mg total) by mouth daily with breakfast.  Dispense: 90 capsule; Refill: 0  2. Anxiety  - venlafaxine XR (EFFEXOR-XR) 37.5 MG 24 hr capsule; Take 1 capsule (37.5 mg total) by mouth daily with breakfast.  Dispense: 90 capsule; Refill: 0 - Resume LORazepam (ATIVAN) 0.5 MG tablet; Take 1 tablet (0.5 mg total) by mouth 2 (two) times daily as needed for anxiety.  Dispense: 45 tablet; Refill: 1  Patient was informed regarding counseling services available in the clinic. Follow-up in 2 months.   Zena Amos, MD 9/1/20219:36 AM

## 2019-09-20 ENCOUNTER — Telehealth (HOSPITAL_COMMUNITY): Payer: Self-pay | Admitting: *Deleted

## 2019-09-20 NOTE — Telephone Encounter (Signed)
VM left on writers phone asking to given information to Dr Evelene Croon re her need for something to help her sleep and also states she failed to tell her some of the medications she gets from her primary Dr and wanted to give her an updated list. Called her back to get clarity, these are her concerns: She had been taking with good outcomes 75 mg of Effexor. She had backed herself down to 37.5 mg. She states she is having some anxiety, and difficulty sleeping and wondering if she needs to return to the 75 mg. Secondly she had good success with Vistaril in the past, and wonders if she can have that on hand for anxiety rather than the Ativan Dr Evelene Croon prescribed. Lastly, she took 25 mg of her partners Trazdone and slept better so wants Dr to consider her adding Trazodone to her regimen. She states she is very sensitive to medications and doesn't want more than needed. Informed her Dr out of the office today, it will probably be tomorrow before her questions can be answered.

## 2019-09-22 ENCOUNTER — Telehealth (HOSPITAL_COMMUNITY): Payer: Self-pay | Admitting: *Deleted

## 2019-09-22 MED ORDER — VENLAFAXINE HCL ER 75 MG PO CP24: 75.0000 mg | ORAL_CAPSULE | Freq: Every day | ORAL | 1 refills | Status: DC

## 2019-09-22 MED ORDER — TRAZODONE HCL 50 MG PO TABS: ORAL_TABLET | ORAL | 1 refills | Status: DC

## 2019-09-22 MED ORDER — HYDROXYZINE HCL 25 MG PO TABS: 25.0000 mg | ORAL_TABLET | Freq: Two times a day (BID) | ORAL | 1 refills | Status: DC | PRN

## 2019-09-22 NOTE — Telephone Encounter (Signed)
Prescription for 75 mg Effexor XR sent along with hydroxyzine 25 mg twice daily as needed dose in addition to trazodone 25 mg at bedtime as needed dose.

## 2019-09-22 NOTE — Telephone Encounter (Signed)
Patient called back after writer left her a message re changes in her medications per Dr Evelene Croon. She called back with one more concern. One of her medical Drs put her on Lamictal awhile back, she states she is not bipolar and is currently taking 25 mg a day. She would like to know if she can stop it. Will consult Dr Evelene Croon re this for her answer.

## 2019-09-22 NOTE — Telephone Encounter (Signed)
She may discontinue Lamictal.

## 2019-09-22 NOTE — Telephone Encounter (Signed)
Resending this phone message as Dr Evelene Croon out of the office at the time of the call.

## 2019-09-22 NOTE — Telephone Encounter (Signed)
Called patient back with the changes made per Dr Evelene Croon. VM left for her with specific instructions and encouraged to call back if needed.

## 2019-09-22 NOTE — Addendum Note (Signed)
Addended by: Zena Amos on: 09/22/2019 01:35 PM   Modules accepted: Orders

## 2019-09-30 ENCOUNTER — Telehealth (HOSPITAL_COMMUNITY): Payer: Self-pay | Admitting: *Deleted

## 2019-09-30 MED ORDER — VENLAFAXINE HCL ER 37.5 MG PO CP24: 37.5000 mg | ORAL_CAPSULE | Freq: Every day | ORAL | 1 refills | Status: AC

## 2019-09-30 NOTE — Telephone Encounter (Signed)
If the patient thinks the symptoms are directly related to increase in the dose of Effexor we can reduce the dose back down to 37.5 mg daily. New prescription sent.

## 2019-09-30 NOTE — Telephone Encounter (Signed)
Call from Chellie states lately she has been feeling bad, feeling like she wants to run, uncomfortable, anxious, feels it is related to her somewhat recent increase in Effexor. She has been avoiding taking her anxiety meds but encouraged to take one today. She is at work but states she feels so bad now she is going to leave work now. Will consult with Dr Evelene Croon re her backing it down to Effexor 37.5 mg.

## 2019-10-01 ENCOUNTER — Telehealth (HOSPITAL_COMMUNITY): Payer: Self-pay | Admitting: *Deleted

## 2019-10-01 NOTE — Telephone Encounter (Signed)
Called this am for clarification re her call yesterday about decreasing the dose of her Effexor. Dr Evelene Croon agreed for her to decrease it and called a new RX in for her. Writer had encouraged her yesterday to take her Ativan when she experiences high anxiety and she did and reports feeling better. States generally she is very resistant to taking meds exp benzos. 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 Discussed Ativan having a time and place and not a problem if she used it appropriately and the increase in Effexor may have been to activating for her. To call as needed but plan to take the 37.5 dose this am and to use Ativan carefully but at needed. Offered her words of support.

## 2019-10-06 ENCOUNTER — Other Ambulatory Visit (HOSPITAL_COMMUNITY): Payer: Self-pay | Admitting: Psychiatry

## 2019-10-14 ENCOUNTER — Other Ambulatory Visit (HOSPITAL_COMMUNITY): Payer: Self-pay | Admitting: Psychiatry

## 2019-11-04 ENCOUNTER — Ambulatory Visit (HOSPITAL_COMMUNITY): Payer: Self-pay | Admitting: Psychiatry

## 2019-11-13 ENCOUNTER — Other Ambulatory Visit (HOSPITAL_COMMUNITY): Payer: Self-pay | Admitting: Psychiatry

## 2020-01-12 ENCOUNTER — Other Ambulatory Visit (HOSPITAL_COMMUNITY): Payer: Self-pay | Admitting: Psychiatry

## 2020-01-12 ENCOUNTER — Other Ambulatory Visit: Payer: Self-pay

## 2020-01-12 ENCOUNTER — Other Ambulatory Visit: Payer: Self-pay | Admitting: Family Medicine

## 2020-01-12 ENCOUNTER — Ambulatory Visit
Admission: RE | Admit: 2020-01-12 | Discharge: 2020-01-12 | Disposition: A | Discharge status: Home / Self Care | Payer: Self-pay | Source: Ambulatory Visit | Attending: Family Medicine | Admitting: Family Medicine

## 2020-01-12 DIAGNOSIS — F419 Anxiety disorder, unspecified: Secondary | ICD-10-CM

## 2020-01-12 DIAGNOSIS — Z1231 Encounter for screening mammogram for malignant neoplasm of breast: Secondary | ICD-10-CM

## 2020-01-12 DIAGNOSIS — F3342 Major depressive disorder, recurrent, in full remission: Secondary | ICD-10-CM

## 2020-02-11 ENCOUNTER — Telehealth (HOSPITAL_COMMUNITY): Payer: Self-pay | Admitting: *Deleted

## 2020-02-11 NOTE — Telephone Encounter (Signed)
This patient had contacted you in the past and informed you that she is going to continue to follow-up with her PCP as there will be writing her prescriptions for Effexor in the future.  Patient is no longer under my care and I will not be refilling any prescriptions for her. If she needs any prescriptions then she will need to make an appointment.

## 2020-02-11 NOTE — Telephone Encounter (Signed)
Notified patient of information she should continue getting her Effexor thru her PCP. She responded it was not problem and she would follow up with her PCP. She was told she is no longer under our care.

## 2020-02-11 NOTE — Telephone Encounter (Signed)
VM from patient requesting Effexor. Record reviewed and should have been out of medicine by end of Nov. And does not have a follow up appt scheduled, her last scheduled appt in 10 was cancelled. Will forward request to Dr Evelene Croon to see how she would like to go forward with this request.

## 2020-06-27 ENCOUNTER — Telehealth: Payer: Self-pay | Admitting: Internal Medicine

## 2020-06-27 NOTE — Telephone Encounter (Signed)
ERROR

## 2020-07-04 ENCOUNTER — Telehealth: Payer: Self-pay | Admitting: Internal Medicine

## 2020-07-04 NOTE — Telephone Encounter (Signed)
Error. GB

## 2020-12-06 ENCOUNTER — Other Ambulatory Visit: Payer: Self-pay | Admitting: Gastroenterology

## 2020-12-06 DIAGNOSIS — R1013 Epigastric pain: Secondary | ICD-10-CM

## 2020-12-07 ENCOUNTER — Ambulatory Visit
Admission: RE | Admit: 2020-12-07 | Discharge: 2020-12-07 | Disposition: A | Discharge status: Home / Self Care | Payer: 59 | Source: Ambulatory Visit | Attending: Gastroenterology | Admitting: Gastroenterology

## 2020-12-07 DIAGNOSIS — R1013 Epigastric pain: Secondary | ICD-10-CM

## 2020-12-22 ENCOUNTER — Other Ambulatory Visit: Payer: Self-pay | Admitting: Surgery

## 2020-12-22 DIAGNOSIS — K8020 Calculus of gallbladder without cholecystitis without obstruction: Secondary | ICD-10-CM

## 2021-01-02 NOTE — H&P (Signed)
REFERRING PHYSICIAN:  Theda Belfast, MD   PROVIDER:  Wayne Both, MD   MRN: [id number] DOB: June 22, 1955 DATE OF ENCOUNTER: 12/22/2020 Subjective  Plan   Chief Complaint: New Consultation (Gallstones)     History of Present Illness: Emily Fowler is a 65 y.o. female who is seen today as an office consultation at the request of Dr. Elnoria Howard for evaluation of New Consultation (Gallstones)  .     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  She has had 2 recent attacks of epigastric abdominal pain hurting into the chest as well as to the left side of the abdomen once.  She had nausea and projectile vomiting with this.  She has been having mild nausea since then.  It is related to fatty meals.  She underwent an ultrasound showing cholelithiasis.  She denies jaundice.  Bowel movements are normal.  Her recent colonoscopy showed only a small benign polyp.  She is otherwise healthy without complaints and has no cardiopulmonary issues.    Review of Systems: A complete review of systems was obtained from the patient.  I have reviewed this information and discussed as appropriate with the patient.  See HPI as well for other ROS.   Review of Systems  All other systems reviewed and are negative.     Medical History: Past Medical History      Past Medical History:  Diagnosis Date   Anxiety     Arthritis     GERD (gastroesophageal reflux disease)     Hyperlipidemia     Hypertension             Patient Active Problem List  Diagnosis   Anxiety   ASCUS with positive high risk human papillomavirus of vagina   Biceps tendinopathy, right   Breast cyst, left   Chronic neck pain   DDD (degenerative disc disease), lumbar   Dyslipidemia   Hypertension   Incomplete tear of right rotator cuff   MDD (major depressive disorder), recurrent, in full remission (CMS-HCC)   Migraines   Palpitations   Postmenopausal bleeding   Prediabetes   Vasomotor symptoms due  to menopause      Past Surgical History       Past Surgical History:  Procedure Laterality Date   APPENDECTOMY   1996   Back Surgery    1995   Carpal Tunnel Surgery    12/2019   Elbow Surgery  Right 2019        Allergies       Allergies  Allergen Reactions   Fluoxetine Diarrhea and Other (See Comments)      "Felt like losing my mind" "Felt like losing my mind"                Current Outpatient Medications on File Prior to Visit  Medication Sig Dispense Refill   folic acid (FOLVITE) 1 MG tablet Take 1 tablet (1,000 mcg total) by mouth once daily       Lactobac no.41/Bifidobact no.7 (PROBIOTIC-10 ORAL) Take 1 tablet by mouth once daily       lisinopriL (ZESTRIL) 10 MG tablet Take by mouth       methotrexate (RHEUMATREX) 2.5 MG tablet         venlafaxine (EFFEXOR-XR) 37.5 MG XR capsule Take by mouth        No current facility-administered medications on file prior to visit.      Family History  History reviewed. No pertinent family  history.      Social History        Tobacco Use  Smoking Status Former   Types: Cigarettes  Smokeless Tobacco Never      Social History  Social History         Socioeconomic History   Marital status: Divorced  Tobacco Use   Smoking status: Former      Types: Cigarettes   Smokeless tobacco: Never  Vaping Use   Vaping Use: Never used  Substance and Sexual Activity   Alcohol use: Yes   Drug use: Never        Objective:       Vitals:    12/22/20 1028  BP: 124/82  Pulse: 95  SpO2: 99%  Weight: 69.4 kg (153 lb)  Height: 162.6 cm (5\' 4" )    Body mass index is 26.26 kg/m.   Physical Exam    She appears well on exam.  There is very mild tenderness in the epigastrium with minimal guarding on exam.  There is no hepatomegaly.  There is no jaundice   Labs, Imaging and Diagnostic Testing: I have reviewed her right upper quadrant ultrasound showing gallstones.  The bile duct was normal and there is no gallbladder wall  thickening   Assessment and Plan:     Diagnoses and all orders for this visit:   Symptomatic cholelithiasis   Other orders -     ondansetron (ZOFRAN-ODT) 4 MG disintegrating tablet; Take 1 tablet (4 mg total) by mouth every 8 (eight) hours as needed for Nausea for up to 7 days       This is a patient with symptomatic cholelithiasis and I suspect some chronic cholecystitis.  Given her continued nausea and occasional discomfort, urgent cholecystectomy is recommended.  I discussed the laparoscopic technique with her and gave her literature regarding this.  We discussed her diagnosis in general.  She does wish to proceed with surgery.  We discussed the risk of surgery which includes but is not limited to bleeding, infection, the need to convert to an open procedure, bile duct injury, bile leak, postoperative recovery, etc.  Surgery will be scheduled urgently

## 2021-01-25 ENCOUNTER — Encounter (HOSPITAL_BASED_OUTPATIENT_CLINIC_OR_DEPARTMENT_OTHER): Admission: RE | Payer: Self-pay | Source: Ambulatory Visit

## 2021-01-25 ENCOUNTER — Ambulatory Visit (HOSPITAL_BASED_OUTPATIENT_CLINIC_OR_DEPARTMENT_OTHER): Admission: RE | Admit: 2021-01-25 | Payer: 59 | Source: Ambulatory Visit | Admitting: Surgery

## 2021-01-25 SURGERY — LAPAROSCOPIC CHOLECYSTECTOMY
Anesthesia: General

## 2021-04-06 ENCOUNTER — Other Ambulatory Visit: Payer: Self-pay | Admitting: Family Medicine

## 2021-04-06 DIAGNOSIS — Z1231 Encounter for screening mammogram for malignant neoplasm of breast: Secondary | ICD-10-CM

## 2021-04-26 ENCOUNTER — Ambulatory Visit
Admission: RE | Admit: 2021-04-26 | Discharge: 2021-04-26 | Disposition: A | Discharge status: Home / Self Care | Payer: 59 | Source: Ambulatory Visit | Attending: Family Medicine | Admitting: Family Medicine

## 2021-04-26 DIAGNOSIS — Z1231 Encounter for screening mammogram for malignant neoplasm of breast: Secondary | ICD-10-CM

## 2021-12-26 NOTE — Telephone Encounter (Signed)
Patient called to be set up as a new patient of: Dr. Kerry Dory    New Patient appointment scheduled with:   Date and time of appointment: 05/02/2022 @3 :20pm  New patient appointment sub-type (physical, sick visit (provide symptoms), consult, etc.): consult  Patient's last PCP (name): Dr. Sharrell Ku  Location of last PCP (phone number & address): Tele#: [phone]  Address: [address] [place]. [address] [place], [address]  Date of Last Annual Physical: September 2023  Date of Last Medicare Wellness (if age 16+):   Is there anyone you would authorize to verbally speak for you (make appts, get lab results, etc)? Son Zackery Barefoot  (If yes, list person(s) & notify patient signed release needed)   Pharmacy name & city (also add to Medication Management section):      Instructed Patient: Office no longer accepts Cash as a form of payment. Forms of Payments accepted are Debit or Credit.  Please bring a Photo ID, Insurance Card, and Current medication bottles.   Please arrive 15-20 minutes prior to appointment due to paperwork needing to be filled out.  Please advise medical records are needed prior to first visit. Pediatric patients will need their immunization records prior to visit.  Mask are advised but not required.      For Office: Runner, broadcasting/film/video authorization (does not need signature from patient) to the above former PCP.    Verified insurance in [place].    Call back number: [phone]

## 2022-01-07 LAB — UNMAPPED LAB RESULTS

## 2022-02-11 ENCOUNTER — Ambulatory Visit: Admit: 2022-02-11 | Discharge: 2022-02-11 | Payer: MEDICARE | Attending: Orthopaedic Surgery | Primary: Family Medicine

## 2022-02-11 ENCOUNTER — Encounter: Admit: 2022-02-11 | Primary: Family Medicine

## 2022-02-11 DIAGNOSIS — M25551 Pain in right hip: Secondary | ICD-10-CM

## 2022-02-11 DIAGNOSIS — M7062 Trochanteric bursitis, left hip: Secondary | ICD-10-CM

## 2022-02-11 NOTE — Progress Notes (Signed)
[address]  [place], [place] [address]    [address]  [place], [place] [address]    Phone: [phone]  Fax:      [phone]  E-mail: [email or web address]       HPI  Cynthia Buck is a 67 y.o. female who presents for left lateral hip pain worsening for the past year. She recently moved from [facility] and was seen there about 8 months ago and was given a bursa injection with temporary relief. She has been using NSAIDs as needed for pain. 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  She states that there is minimal groin discomfort and no discomfort over the posterior buttock region of her head and can sometimes ready down the lateral thigh into the anterior medial knee.  She has not had any trouble putting on shoes and socks on that side.  Over-the-counter medications only give temporary relief and she cannot lay on that side at night and can therefore be woken up.    History reviewed. No pertinent past medical history.    History reviewed. No pertinent surgical history.    Social History     Occupational History   . Not on file   Tobacco Use   . Smoking status: Not on file   . Smokeless tobacco: Not on file   Substance and Sexual Activity   . Alcohol use: Not on file   . Drug use: Not on file   . Sexual activity: Not on file       Current Outpatient Medications   Medication Instructions   . codeine-guaifenesin (Robitussin-AC) 10-100 mg/5 mL syrup 5 mL, oral, 3 times daily PRN   . ergocalciferol, vitamin D2, (Vitamin D-2) 1.25 MG (50000 UT) capsule 1 capsule, oral, Weekly   . folic acid (FOLVITE) 1 mg, oral, Daily   . meloxicam (MOBIC) 7.5 mg, oral, Daily PRN   . predniSONE (Deltasone) 10 mg tablet oral       Allergies   Allergen Reactions   . Fluoxetine Diarrhea     Other reaction(s): Other (See Comments), Other (See Comments), Other (See Comments), Unknown  "Felt like losing my mind"  "Felt like losing my mind" "Felt like losing my  mind"    "Felt like losing my mind"  "Felt like losing my mind"  "Felt like losing my mind"  "Felt like losing my mind"         Exam  GENERAL: well appearing, alert and oriented x3, appropriate mood/affect in no acute distress  Left hip: Skin is intact.  Patient has tenderness to palpation over the greater trochanter.  There is mild to minimal swelling in this region.  No tenderness over the ASIS.    Flexion and IR 15-20 is mildly uncomfortable over the lateral hip.  ER to about 30 nontender.    Mildly +Ober and resisted hip flexion strength is 4/5 with minimal irritation.    Sensation intact to light touch distally.  Capillary refill less than 3 seconds    Diagnostic Tests  XR HIP LEFT 2 OR 3 VIEWS  Imaging Result: 02/11/2022    AP pelvis and frog lateral view left hip demonstrates no advanced   degenerative change over the ball and socket joint but there is spurring   seen over the left greater trochanteric region both superiorly and   inferiorly  Impression: Trochanteric bursitis, left hip  Assessment & Plan  I have evaluated the patient and I do think that this is due to truck bursitis and IT band syndrome.It is possible that there may be an abductor tear underneath however I would recommend initial nonoperative course of treatment with physical therapy.  We discussed the potential for an injection to be repeated as the one that she found in [place] to be ineffective for longer than 1 month.  She will just do physical therapy for now and I will reevaluate her in 6-8 weeks at that time    Greater trochanteric bursitis of left hip [M70.62]  Procedures

## 2022-03-05 ENCOUNTER — Ambulatory Visit: Admit: 2022-03-05 | Discharge: 2022-03-05 | Payer: MEDICARE | Attending: Family Medicine | Primary: Family Medicine

## 2022-03-05 DIAGNOSIS — Z0000 Encounter for general adult medical examination without abnormal findings: Secondary | ICD-10-CM

## 2022-03-05 MED ORDER — ALPRAZolam (Xanax) 0.25 mg tablet
0.25 | ORAL_TABLET | ORAL | 0 refills | Status: AC | PRN
Start: 2022-03-05 — End: 2022-10-31

## 2022-03-05 NOTE — Patient Instructions (Addendum)
Please call to set up appointment:   [facility]  [address]  [place]  [phone]    Please call Central Scheduling at [phone] to book your DEXA and mammogram.    OBGYN:  Dr. Ceasar Lund  [address] OB/GYN, [place], [place] [address]     [place] for cystocele:  Dr. Novella Rob  [phone]

## 2022-03-05 NOTE — Assessment & Plan Note (Signed)
Previously was seeing derm and currently taking Methotrexate as prescribed with folate supplementation. Labs pending, including folate

## 2022-03-05 NOTE — Assessment & Plan Note (Addendum)
Well managed currently with Effexor 37.5mg , no refill need today.   Very rare episodes of anxiety/panic attacks, seeking as needed medication to help. Previously tried Vistaril without any improvement. Had noticed relief with Xanax - 10 tablets sent in

## 2022-03-05 NOTE — Assessment & Plan Note (Addendum)
Labs: ordered  EKG: completed. NSR - no previous to compare   Vision exam: completed  Screenings: Planning on establishing with OBGYN. Mammogram UTD and ordered for 2024. Colonoscopy UTD - plan to repeat in 2012. DEXA ordered  Immunizations: Due for PCV20 (not available), and shingles. Tdap, flu, and covid UTD

## 2022-03-05 NOTE — Addendum Note (Signed)
Addended by: Morrison Old on: 03/05/2022 01:48 PM     Modules accepted: Orders

## 2022-03-05 NOTE — Assessment & Plan Note (Signed)
Well managed with Lisinopril 10mg  - declines need for refill today. Labs pending

## 2022-03-05 NOTE — Progress Notes (Addendum)
[facility]  [address]  [place] [address]  Dept: [phone]  Dept Fax: [phone]     Patient ID: Cynthia Buck is a 67 y.o. female who presents for welcome to medicare exam/establish care.     Subjective   HPI  HTN - Lisinopril 10mg    Hx IBS - probiotic   Dyshidrotic eczema - methotrexate and folic acid 1mg  supplementation. Was following with dermatology but does need a new provider.   Depression/anxiety - Effexor 37.5mg . Feels this is managing her sx well.   Gallstones - had seen GI and surgery in the past, who recommended cholecystectomy.     Moved from [place] recently.     Blood pressure - well controlled   Diet/Exercise - conscious of nutrition. Fairly active.   LMP - postmenopausal   PHQ9/GAD7 - Effexor, see above.   Vaccinations - Due for PCV20, shingles. Tdap, covid, flu UTD.   Vision - routine care  Dental - routine care  Pap smear - last done within 5 years. Hx colpos  Mammogram -last done 2023, normal.   Colonoscopy - last done 2023, recommend repeat in 7 years   Lung cancer screening - Quit smoking (few cigarettes) 10 years ago  Bone density - due   Labwork - ordered   Health care proxy/advanced directives - None currently, in the process of determining this      Patient Active Problem List   Diagnosis   . Anxiety   . Dyslipidemia   . Hypertension   . Irritable bowel syndrome   . Migraine headache   . Prediabetes   . Greater trochanteric bursitis of left hip   . Right hip pain   . Welcome to Medicare preventive visit   . Dyshidrotic eczema   . Cystocele with rectocele   . Patient does not have healthcare proxy     Current Outpatient Medications   Medication Instructions   . ALPRAZolam (XANAX) 0.25 mg, oral, As needed   . ergocalciferol, vitamin D2, (Vitamin D-2) 1.25 MG (50000 UT) capsule 1 capsule, oral, Weekly   . folic acid (FOLVITE) 1 mg, oral, Daily   . lisinopril 10 mg  tablet 1 tablet, oral, Daily   . meloxicam (MOBIC) 7.5 mg, oral, Daily PRN   . methotrexate 2.5 mg tablet SMARTSIG:6 By Mouth Once a Week   . ondansetron ODT (ZOFRAN-ODT) 4 mg, oral, Every 8 hours PRN   . venlafaxine (EFFEXOR) 37.5 mg, oral, Daily with evening meal     Allergies   Allergen Reactions   . Fluoxetine Diarrhea     Other reaction(s): Other (See Comments), Other (See Comments), Other (See Comments), Unknown  "Felt like losing my mind"  "Felt like losing my mind" "Felt like losing my mind"    "Felt like losing my mind"  "Felt like losing my mind"  "Felt like losing my mind"  "Felt like losing my mind"       Past Medical History:   Diagnosis Date   . Eczema 01/07/2022       Health Risk Assessment    Psychosocial Risk  Patient Health Questionnaire-2 Score: 0        Objective   Visit Vitals  BP 116/66   Pulse 93   Temp 36.2 C (97.1 F) (Temporal)   Ht 1.626 m   Wt 66.2 kg   SpO2 98%   BMI 25.06 kg/m   BSA 1.73  m       Physical Exam  Constitutional:       General: She is not in acute distress.  HENT:      Right Ear: Tympanic membrane, ear canal and external ear normal.      Left Ear: Tympanic membrane, ear canal and external ear normal.   Cardiovascular:      Rate and Rhythm: Normal rate and regular rhythm.      Pulses: Normal pulses.   Pulmonary:      Effort: Pulmonary effort is normal.      Breath sounds: Normal breath sounds.   Chest:   Breasts:     Right: Normal.      Left: Normal.   Abdominal:      Palpations: Abdomen is soft.      Tenderness: There is no abdominal tenderness.   Musculoskeletal:      Cervical back: Neck supple.   Neurological:      Mental Status: She is alert and oriented to person, place, and time. Mental status is at baseline.   Psychiatric:         Mood and Affect: Mood normal.         Behavior: Behavior normal.         Thought Content: Thought content normal.                   Vision Screen:  Right without corrections: 20/20  Left without corrections: 20/15  Both without corrections:  20/15  Right with corrections: 20/15  Left with corrections: 20/15  Both with corrections: 20/15    Assessment/Plan   Lyann was seen today for medicare annual wellness.  Welcome to Medicare preventive visit  Assessment & Plan:  Labs: ordered  EKG: completed. NSR - no previous to compare   Vision exam: completed  Screenings: Planning on establishing with OBGYN. Mammogram UTD and ordered for 2024. Colonoscopy UTD - plan to repeat in 2012. DEXA ordered  Immunizations: Due for PCV20 (not available), and shingles. Tdap, flu, and covid UTD  Orders:  -     Lipid panel; Future  -     Comprehensive metabolic panel; Future  -     Hemoglobin A1c; Future  -     ECG 12 lead [facility] Performed)  Screening for osteoporosis  -     BD DEXA AXIAL; Future  Dyshidrotic eczema  Assessment & Plan:  Previously was seeing derm and currently taking Methotrexate as prescribed with folate supplementation. Labs pending, including folate   Orders:  -     Ambulatory referral to Dermatology; Future  -     Folate; Future  Screening mammogram for breast cancer  -     BI BILATERAL MAMMOGRAM SCREENING TOMOSYNTHESIS; Future  Postmenopausal  -     BD DEXA AXIAL; Future  Primary hypertension  Assessment & Plan:  Well managed with Lisinopril 10mg  - declines need for refill today. Labs pending   Other irritable bowel syndrome  Assessment & Plan:  Managed well with probiotics  Prediabetes  Assessment & Plan:  Not on medication. A1c pending   Anxiety  Assessment & Plan:  Well managed currently with Effexor 37.5mg , no refill need today.   Very rare episodes of anxiety/panic attacks, seeking as needed medication to help. Previously tried Vistaril without any improvement. Had noticed relief with Xanax - 10 tablets sent in  Orders:  -     ALPRAZolam (Xanax) 0.25 mg tablet; Take 1 tablet (0.25 mg) by mouth if  needed for anxiety.  Cystocele with rectocele  Assessment & Plan:  Requesting urogyne referral due to worsening prolapse sx - order placed   Orders:  -      Ambulatory referral to Urogynecology; Future      Advance Care Planning  Advance Directive/Living Will: No  Health Care Power of Attorney: No  Time in minutes Spent Discussing Advanced Directives: 1-15 minutes    Medicare Screenings Reviewed  Vision screen  PHQ-2  GAD-7  PDMP  Patient Health Questionnaire-2 Score: 0 Interpretation: Negative screening.      Follow-up & Interventions: Maintain annual screening - No additional Follow-up required     Patient Care Team:  Morrison Old, MD as PCP - General (Family Medicine)  Galvin Proffer, MD as Referring Physician (Orthopaedic Surgery)

## 2022-03-05 NOTE — Assessment & Plan Note (Signed)
Requesting urogyne referral due to worsening prolapse sx - order placed

## 2022-03-05 NOTE — Assessment & Plan Note (Signed)
Managed well with probiotics

## 2022-03-05 NOTE — Assessment & Plan Note (Signed)
Not on medication. A1c pending

## 2022-03-20 MED ORDER — ondansetron ODT (Zofran-ODT) 4 mg disintegrating tablet
4 | ORAL_TABLET | Freq: Three times a day (TID) | ORAL | 0 refills | Status: AC | PRN
Start: 2022-03-20 — End: ?

## 2022-03-20 MED ORDER — folic acid (Folvite) 1 mg tablet
1 | Freq: Every day | ORAL | 0 refills | Status: AC
Start: 2022-03-20 — End: ?

## 2022-03-20 NOTE — Telephone Encounter (Signed)
Last OV: 03/05/22  Type:WELCOME TO MEDICARE EXAM  Last PE:  Does patie2/27/24nt have 6th month follow up:  (If not, do outreach)  Last Diabetic OV:10/01/16   Does patient has 3 to 6 month diabetic follow up:  (If not, do outreach)  Last BP OV: 03/05/22  Does patient have 6 month follow up:   (If not, do outreach)  Last TOX screen:   Does patient have 3-6 month follow up:   (If not, do outreach)  Next OV:03/11/23

## 2022-03-20 NOTE — Telephone Encounter (Signed)
Pt is requesting medication to be sent to pharmacy on file.  Contact: [phone], Please contact thank you  Informed patient it can take 3 business days to send prescription refill to pharmacy.

## 2022-03-21 LAB — COMPREHENSIVE METABOLIC PANEL
A/G Ratio: 1.8 (ref 1.2–2.2)
ALT: 39 IU/L — ABNORMAL HIGH (ref 0–32)
AST: 35 IU/L (ref 0–40)
Albumin: 4.6 g/dL (ref 3.9–4.9)
Alk Phosphatase: 86 IU/L (ref 44–121)
Anion Gap: 16 mmol/L (ref 10.0–18.0)
BUN/Creat Ratio: 20 (ref 12–28)
BUN: 12 mg/dL (ref 8–27)
Bili Total: 0.6 mg/dL (ref 0.0–1.2)
Calcium: 9.5 mg/dL (ref 8.7–10.3)
Carbon Dioxide: 21 mmol/L (ref 20–29)
Chloride: 102 mmol/L (ref 96–106)
Creat: 0.6 mg/dL (ref 0.57–1.00)
Globulin Total: 2.5 g/dL (ref 1.5–4.5)
Glucose: 108 mg/dL — ABNORMAL HIGH (ref 70–99)
Potassium: 4.5 mmol/L (ref 3.5–5.2)
Protein Total: 7.1 g/dL (ref 6.0–8.5)
Sodium: 139 mmol/L (ref 134–144)
eGFR: 100 mL/min/{1.73_m2} (ref >59)

## 2022-03-21 LAB — LIPID PANEL
Cholesterol: 230 mg/dL — ABNORMAL HIGH (ref 100–199)
HDL Cholesterol: 70 mg/dL (ref >39)
LDLc Calc (NIH): 137 mg/dL — ABNORMAL HIGH (ref 0–99)
Non-HDL Chol: 160 mg/dL — ABNORMAL HIGH (ref 0–129)
Triglycerides: 129 mg/dL (ref 0–149)
VLDLc Calc: 23 mg/dL (ref 5–40)

## 2022-03-21 LAB — FOLATE: Folate: >20 ng/mL (ref >3.0)

## 2022-03-21 LAB — HEMOGLOBIN A1C: HgbA1C: 5.8 % — ABNORMAL HIGH (ref 4.8–5.6)

## 2022-03-27 ENCOUNTER — Ambulatory Visit: Admit: 2022-03-27 | Discharge: 2022-03-27 | Payer: MEDICARE | Attending: Orthopaedic Surgery | Primary: Family Medicine

## 2022-03-27 DIAGNOSIS — M7062 Trochanteric bursitis, left hip: Secondary | ICD-10-CM

## 2022-03-27 MED ORDER — meloxicam (Mobic) 7.5 mg tablet
7.5 | ORAL_TABLET | Freq: Every day | ORAL | 0 refills | Status: DC
Start: 2022-03-27 — End: 2022-04-23

## 2022-03-27 NOTE — Progress Notes (Signed)
[address]  [place], [place] [address]    [address]  [place],  [address]    Phone: [phone]  Fax:      [phone]  E-mail: [email or web address]       HPI  Cynthia Buck is a 67 y.o. female who presents for left hip pain.  Last seen 02/11/22, he has been doing PT for trochanteric bursitis and ITBS. She has been seeing relief from this and the pain at night is better. She uses Meloxicam PRN pain.  No numbness and tingling.  No night pain.  She feels that she is seeing improvement and has been also using activity modification.    Past Medical History:   Diagnosis Date   . Dyslipidemia 06/18/2013   . Eczema 01/07/2022   . Hypercholesteremia 03/21/2022   . Hypertension 06/18/2013   . Irritable bowel syndrome 03/24/2020   . Prediabetes 06/18/2013       History reviewed. No pertinent surgical history.    Social History     Occupational History   . Not on file   Tobacco Use   . Smoking status: Not on file   . Smokeless tobacco: Not on file   Substance and Sexual Activity   . Alcohol use: Not on file   . Drug use: Not on file   . Sexual activity: Not on file       Current Outpatient Medications   Medication Instructions   . ALPRAZolam (XANAX) 0.25 mg, oral, As needed   . ergocalciferol, vitamin D2, (Vitamin D-2) 1.25 MG (50000 UT) capsule 1 capsule, oral, Weekly   . folic acid (FOLVITE) 1 mg, oral, Daily   . lisinopril 10 mg tablet 1 tablet, oral, Daily   . meloxicam (MOBIC) 7.5 mg, oral, Daily PRN   . methotrexate 2.5 mg tablet SMARTSIG:6 By Mouth Once a Week   . ondansetron ODT (ZOFRAN-ODT) 4 mg, oral, Every 8 hours PRN   . venlafaxine (EFFEXOR) 37.5 mg, oral, Daily with evening meal       Allergies   Allergen Reactions   . Fluoxetine Diarrhea     Other reaction(s): Other (See Comments), Other (See Comments), Other (See Comments), Unknown  "Felt like losing my mind"  "Felt like losing my mind" "Felt like losing my mind"    "Felt like losing my mind"  "Felt like losing my mind"  "Felt like losing my  mind"  "Felt like losing my mind"         Exam  GENERAL: well appearing, alert and oriented x3, appropriate mood/affect in no acute distress  Left hip: Skin is intact.  Patient has tenderness to palpation over the greater trochanter.  There is mild to minimal swelling in this region.  No tenderness over the ASIS.    Flexion and IR 15-20 is mildly uncomfortable over the lateral hip.  ER to about 30 nontender.    Mildly +Ober and resisted hip flexion strength is 4/5 with minimal irritation.    Sensation intact to light touch distally.  Capillary refill less than 3 seconds  Reexamined today    Diagnostic Tests  XR HIP LEFT 2 OR 3 VIEWS  Imaging Result: 02/11/2022    AP pelvis and frog lateral view left hip demonstrates no advanced   degenerative change over the ball and socket joint but there is spurring   seen over the left greater trochanteric region both superiorly and   inferiorly  Impression: Trochanteric bursitis, left hip  Assessment & Plan  She has been doing a little bit better and managing well with the PT and the exercises that she has been doing as well as activity modification and other modalities such as NSAIDs.  She has a trip coming up to [place] in the coming weeks and I will also refill her meloxicam and I will see her back if symptoms should worsen over the next couple of months.  We will hold off on any cortisone injections in the meantime.    Greater trochanteric bursitis of left hip [M70.62]  Procedures

## 2022-04-10 ENCOUNTER — Ambulatory Visit: Admit: 2022-04-10 | Discharge: 2022-04-10 | Payer: MEDICARE | Attending: Orthopaedic Surgery | Primary: Family Medicine

## 2022-04-10 ENCOUNTER — Encounter: Admit: 2022-04-10 | Primary: Family Medicine

## 2022-04-10 DIAGNOSIS — M25561 Pain in right knee: Secondary | ICD-10-CM

## 2022-04-10 DIAGNOSIS — M1711 Unilateral primary osteoarthritis, right knee: Secondary | ICD-10-CM

## 2022-04-10 MED ORDER — BUPivacaine HCl (Marcaine) 0.5 % (5 mg/mL) injection 4 mL
0.5 | Freq: Once | INTRAMUSCULAR | Status: AC | PRN
Start: 2022-04-10 — End: 2022-04-10
  Administered 2022-04-11: 04:00:00 4 mL via INTRAMUSCULAR

## 2022-04-10 MED ORDER — methylPREDNISolone acetate (DEPO-Medrol) injection 80 mg
40 | Freq: Once | INTRAMUSCULAR | Status: AC | PRN
Start: 2022-04-10 — End: 2022-04-10
  Administered 2022-04-11: 04:00:00 80 mg via INTRA_ARTICULAR

## 2022-04-10 NOTE — Progress Notes (Signed)
[address]  [place], [place] [address]    [address]  [place], [place] [address]    Phone: [phone]  Fax:      [phone]  E-mail: [email or web address]       HPI  Cynthia Buck is a 67 y.o. female who presents for left trochanteric bursitis and a new complaint of right knee pain.  Regarding the left hip, she has used meloxicam and had a previous cortisone injection and PT with relief. She reports the hip pain has started to return and she is going on a trip to [place] on Saturday for 10 days and will be quite active.  The location of the pain is lateral based and not related to the groin or in the low back.  She has a new complaint of knee pain after playing pickleball.  This has been more problem over the last 5 days where she has noticed swelling in her knee as well.  She had an untreated ski injury 30+ years ago in there was swelling that initially and got better with nonoperative treatment.  She states that now it is tight to flex and does feel slightly unstable.  She feels that a knee sleeve has been helpful.  She denies locking as well as significant night pain    Past Medical History:   Diagnosis Date   . Dyslipidemia 06/18/2013   . Eczema 01/07/2022   . Hypercholesteremia 03/21/2022   . Hypertension 06/18/2013   . Irritable bowel syndrome 03/24/2020   . Prediabetes 06/18/2013       No past surgical history on file.    Social History     Occupational History   . Not on file   Tobacco Use   . Smoking status: Not on file   . Smokeless tobacco: Not on file   Substance and Sexual Activity   . Alcohol use: Not on file   . Drug use: Not on file   . Sexual activity: Not on file       Current Outpatient Medications   Medication Instructions   . ALPRAZolam (XANAX) 0.25 mg, oral, As needed   . ergocalciferol, vitamin D2, (Vitamin D-2) 1.25 MG (50000 UT) capsule 1 capsule, oral, Weekly   . folic acid (FOLVITE) 1 mg, oral, Daily   . lisinopril 10 mg tablet 1 tablet, oral, Daily   . meloxicam (MOBIC) 7.5  mg, oral, Daily PRN   . meloxicam (MOBIC) 7.5 mg, oral, Daily   . methotrexate 2.5 mg tablet SMARTSIG:6 By Mouth Once a Week   . ondansetron ODT (ZOFRAN-ODT) 4 mg, oral, Every 8 hours PRN   . venlafaxine (EFFEXOR) 37.5 mg, oral, Daily with evening meal       Allergies   Allergen Reactions   . Fluoxetine Diarrhea     Other reaction(s): Other (See Comments), Other (See Comments), Other (See Comments), Unknown  "Felt like losing my mind"  "Felt like losing my mind" "Felt like losing my mind"    "Felt like losing my mind"  "Felt like losing my mind"  "Felt like losing my mind"  "Felt like losing my mind"         Exam  GENERAL: well appearing, alert and oriented x3, appropriate mood/affect in no acute distress  Left hip: Skin is intact.  Patient has tenderness to palpation over the greater trochanter.  There is mild to minimal swelling in this region.  No tenderness over the ASIS.    Flexion and IR 15-20  is mildly uncomfortable over the lateral hip.  ER to about 30 nontender.    Mildly +Ober and resisted hip flexion strength is 4/5 with minimal irritation.    Sensation intact to light touch distally.  Capillary refill less than 3 seconds    Right Knee:   Skin intact.  PROM 0 - 120 degrees  Soft Tissue Swelling -   Effusion - mild-moderate  Pinch - pos  Patellar facet -   Patella grind - pos  MJLT - pos  LJLT -   McMurray -  Varus stress -  Valgus stress -  Lachman -  Posterior Drawer -  Dial Test -  Capillary refill less than 2 seconds  Sensation intact to LT over m/r/u distributions    Diagnostic Tests  XR KNEE RIGHT 4+ VIEWS  Imaging Result: 04/10/2022    AP/lateral/sunrise/Rosenberg right knee demonstrates mild spurring with no   significant joint space narrowing and no fracture seen  Impression: Mild DJD, right knee      XR KNEE RIGHT 4+ VIEWS  Imaging Result: 04/10/2022    AP/lateral/sunrise/Rosenberg right knee demonstrates mild spurring with no   significant joint space narrowing and no fracture seen  Impression:  Mild DJD, right knee    Assessment & Plan  Aleatha has 2 problems today both legs up: The first is a flareup of her left trochanteric bursitis and the next is her right knee which likely is due to DJD exacerbation.  I would recommend nonoperative course of treatment with cortisone injection and aspiration for the right knee along with cortisone and these were prescribed and administered today in preparation for her trip to [place].  She will consider some self-directed PT exercises and I will see her back in 4 to 6 weeks for reevaluation.  Consider MRI if symptoms or not significantly improved with regards to her right knee.    Primary osteoarthritis of right knee [M17.11]  L Inj/Asp: R knee on 04/10/2022 11:57 PM  Indications: pain and joint swelling  Details: 22 G needle, ultrasound-guided superolateral approach  Medications: 80 mg methylPREDNISolone acetate 40 mg/mL; 4 mL BUPivacaine HCl 0.5 % (5 mg/mL)  Aspirate: 11 mL clear and yellow  Outcome: tolerated well, no immediate complications    Procedure, treatment alternatives, risks and benefits explained, specific risks discussed. Consent was given by the patient. Immediately prior to procedure a time out was called to verify the correct patient, procedure, equipment, support staff and site/side marked as required. Patient was prepped and draped in the usual sterile fashion.     Patient tolerance: tolerated well, no immediate complications    L Inj/Asp: L greater trochanteric bursa on 04/10/2022 11:57 PM  Indications: pain  Details: 22 G needle, ultrasound-guided lateral approach  Medications: 80 mg methylPREDNISolone acetate 40 mg/mL; 4 mL BUPivacaine HCl 0.5 % (5 mg/mL)  Outcome: tolerated well, no immediate complications    Procedure, treatment alternatives, risks and benefits explained, specific risks discussed. Consent was given by the patient. Immediately prior to procedure a time out was called to verify the correct patient, procedure, equipment, support staff  and site/side marked as required. Patient was prepped and draped in the usual sterile fashion.     Patient tolerance: tolerated well, no immediate complications

## 2022-05-02 ENCOUNTER — Encounter: Payer: MEDICARE | Attending: Internal Medicine | Primary: Family Medicine

## 2022-05-06 ENCOUNTER — Ambulatory Visit: Admit: 2022-05-06 | Discharge: 2022-05-06 | Payer: MEDICARE | Attending: Orthopaedic Surgery | Primary: Family Medicine

## 2022-05-06 DIAGNOSIS — M1711 Unilateral primary osteoarthritis, right knee: Secondary | ICD-10-CM

## 2022-05-06 MED ORDER — SUMAtriptan (Imitrex) 100 mg tablet
100 | ORAL_TABLET | Freq: Once | ORAL | 1 refills | 25.50000 days | Status: DC | PRN
Start: 2022-05-06 — End: 2022-07-17

## 2022-05-06 NOTE — Progress Notes (Signed)
[address]  [place], [place] [address]    [address]  [place], [place] [address]    Phone: [phone]  Fax:      [phone]  E-mail: [email or web address]       HPI  Cynthia Buck is a 67 y.o. female who presents for right knee and left hip pain.  Last seen 04/10/22, she had cortisone injections for both. She was in [place] and did hiking and walking. She denies pain at present but has some discomfort in the hip.  She states that the pain has been minimal and that she has been able to do standing and walking and not have much discomfort as a result.  Once in a while she may have some lateral based hip pain but this is something that does not bother her.  She states that the pain can radiate down the leg as if it was numb but this is very slight    Past Medical History:   Diagnosis Date   . Dyslipidemia 06/18/2013   . Eczema 01/07/2022   . Hypercholesteremia 03/21/2022   . Hypertension 06/18/2013   . Irritable bowel syndrome 03/24/2020   . Prediabetes 06/18/2013       History reviewed. No pertinent surgical history.    Social History     Occupational History   . Not on file   Tobacco Use   . Smoking status: Not on file   . Smokeless tobacco: Not on file   Substance and Sexual Activity   . Alcohol use: Not on file   . Drug use: Not on file   . Sexual activity: Not on file       Current Outpatient Medications   Medication Instructions   . ALPRAZolam (XANAX) 0.25 mg, oral, As needed   . ergocalciferol, vitamin D2, (Vitamin D-2) 1.25 MG (50000 UT) capsule 1 capsule, oral, Weekly   . folic acid (FOLVITE) 1 mg, oral, Daily   . lisinopril 10 mg tablet 1 tablet, oral, Daily   . meloxicam (MOBIC) 7.5 mg, oral, Daily PRN   . meloxicam (MOBIC) 7.5 mg, oral, Daily   . methotrexate 2.5 mg tablet SMARTSIG:6 By Mouth Once a Week   . ondansetron ODT (ZOFRAN-ODT) 4 mg, oral, Every 8 hours PRN   . SUMAtriptan (IMITREX) 100 mg, oral, Once as needed, May repeat after 2 hours.   Marland Kitchen venlafaxine (EFFEXOR) 37.5 mg, oral, Daily  with evening meal       Allergies   Allergen Reactions   . Fluoxetine Diarrhea     Other reaction(s): Other (See Comments), Other (See Comments), Other (See Comments), Unknown  "Felt like losing my mind"  "Felt like losing my mind" "Felt like losing my mind"    "Felt like losing my mind"  "Felt like losing my mind"  "Felt like losing my mind"  "Felt like losing my mind"         Exam  GENERAL: well appearing, alert and oriented x3, appropriate mood/affect in no acute distress  Left hip: Skin is intact.  Patient has no tenderness to palpation over the greater trochanter.  There is mild to minimal swelling in this region.  No tenderness over the ASIS.    Flexion and IR 15-20 is mildly uncomfortable over the lateral hip.  ER to about 30 nontender.    Mildly +Ober and resisted hip flexion strength is 5/5 with minimal irritation.    Sensation intact to light touch distally.  Capillary refill less than 3  seconds    Right Knee:   Skin intact.  PROM 0 - 130 degrees  Soft Tissue Swelling -   Effusion -   Pinch -   Patellar facet -   Patella grind -   MJLT -   LJLT -   McMurray -  Varus stress -  Valgus stress -  Lachman -  Posterior Drawer -  Dial Test -  Capillary refill less than 2 seconds  Sensation intact to LT over m/r/u distributions  Reexamined today    Diagnostic Tests  L Inj/Asp: L greater trochanteric bursa  Maple Hudson, MD     04/10/2022 11:58 PM  L Inj/Asp: L greater trochanteric bursa on 04/10/2022 11:57 PM  Indications: pain  Details: 22 G needle, ultrasound-guided lateral approach  Medications: 80 mg methylPREDNISolone acetate 40 mg/mL; 4 mL BUPivacaine   HCl 0.5 % (5 mg/mL)  Outcome: tolerated well, no immediate complications    Procedure, treatment alternatives, risks and benefits explained, specific   risks discussed. Consent was given by the patient. Immediately prior to   procedure a time out was called to verify the correct patient, procedure,   equipment, support staff and site/side marked as required.  Patient was   prepped and draped in the usual sterile fashion.     Patient tolerance: tolerated well, no immediate complications  L Inj/Asp: R knee  Maple Hudson, MD     04/10/2022 11:58 PM  L Inj/Asp: R knee on 04/10/2022 11:57 PM  Indications: pain and joint swelling  Details: 22 G needle, ultrasound-guided superolateral approach  Medications: 80 mg methylPREDNISolone acetate 40 mg/mL; 4 mL BUPivacaine   HCl 0.5 % (5 mg/mL)  Aspirate: 11 mL clear and yellow  Outcome: tolerated well, no immediate complications    Procedure, treatment alternatives, risks and benefits explained, specific   risks discussed. Consent was given by the patient. Immediately prior to   procedure a time out was called to verify the correct patient, procedure,   equipment, support staff and site/side marked as required. Patient was   prepped and draped in the usual sterile fashion.     Patient tolerance: tolerated well, no immediate complications  XR KNEE RIGHT 4+ VIEWS  Imaging Result: 04/10/2022    AP/lateral/sunrise/Rosenberg right knee demonstrates mild spurring with no   significant joint space narrowing and no fracture seen  Impression: Mild DJD, right knee     Assessment & Plan  I have discussed my findings with the patient and she is doing better today with regards to both her right knee and her left hip.  I would recommend that she continue her PT exercises for stretching and strengthening which I think is important for her to maintain her pain relief for both her right knee and the left hip.  I would recommend that she continue this and I will see her back on an as-needed basis.  If her symptoms over her left hip seem to get worse she will let me know and we will continue to work that up to see if this is something related to the hip or her low back.    Primary osteoarthritis of right knee [M17.11]  Procedures

## 2022-05-07 ENCOUNTER — Encounter: Payer: MEDICARE | Attending: Family Medicine | Primary: Family Medicine

## 2022-05-22 MED ORDER — lisinopril 10 mg tablet
10 | ORAL_TABLET | Freq: Every day | ORAL | 1 refills | Status: AC
Start: 2022-05-22 — End: ?

## 2022-05-22 MED ORDER — venlafaxine (Effexor) 37.5 mg tablet
37.5 | ORAL_TABLET | Freq: Every day | ORAL | 1 refills | Status: DC
Start: 2022-05-22 — End: 2022-05-29

## 2022-05-22 NOTE — Telephone Encounter (Signed)
Department Name:     Patient: Cynthia Buck  MRN: [id number]  Agent: Daphane Shepherd    [facility] Scheduling Message    Patient called to request a refill of:  Lisinopril  Venlafaxine        Pharmacy:    [facility] [phone]  [facility]    Informed patient it can take 3 business days to send prescription refill to pharmacy.    Call back number: [phone]

## 2022-05-22 NOTE — Telephone Encounter (Signed)
Last OV: 03/05/22  Type: Establish care  Last PE: 03/05/22  Does patient have 6th month follow up:  (If not, do outreach)  Last Diabetic OV:   Does patient has 3 to 6 month diabetic follow up:  (If not, do outreach)  Last BP OV: 116/66  Does patient have 6 month follow up:   (If not, do outreach)  Last TOX screen:   Does patient have 3-6 month follow up:   (If not, do outreach)  Next OV: 03/11/23

## 2022-05-29 MED ORDER — venlafaxine XR (Effexor-XR) 37.5 mg 24 hr capsule
37.5 | ORAL_CAPSULE | Freq: Every day | ORAL | 1 refills | Status: AC
Start: 2022-05-29 — End: 2022-11-25

## 2022-05-29 NOTE — Telephone Encounter (Signed)
Patient called stating she picked up her Venlafaxine but she was given tablets and it should be the extended release capsules.  Patient can be reached at [phone].

## 2022-05-29 NOTE — Telephone Encounter (Signed)
Capsules sent spoke with Gavin Pound

## 2022-06-04 ENCOUNTER — Ambulatory Visit: Payer: MEDICARE | Primary: Family Medicine

## 2022-06-18 NOTE — Telephone Encounter (Signed)
Appointment:        Date of patient's next encounter in the current department:  Visit date not found   Date of patient's next encounter with the current provider:  Visit date not found   Date of patient's last encounter in the current department: Visit date not found      Last BP:   BP Readings from Last 3 Encounters:   03/05/22 116/66       Last HGBA1C:   HgbA1C (%)   Date Value   03/20/2022 5.8 (H)       Labs  No results found for: "TSH"  Lab Results   Component Value Date    CALCIUM 9.5 03/20/2022    ALBUMIN 4.6 03/20/2022    NA 139 03/20/2022    K 4.5 03/20/2022    CO2 21 03/20/2022    CL 102 03/20/2022    BUN 12 03/20/2022    CREATININE 0.60 03/20/2022      AST   Date Value Ref Range Status   03/20/2022 35 0 - 40 IU/L Final     ALT   Date Value Ref Range Status   03/20/2022 39 (H) 0 - 32 IU/L Final     No components found for: "TBILI"  No results found for: "HGB"  No results found for: "HCT"  No results found for: "PLT"  No results found for: "PTT"  No results found for: "INR"  No results found for: "PROTIME"  No results found for: "PTADJUSTED"    Last Urine Drug Screen: on .  Narcotic Medications:    Directives/Controlled Substance Agreement   PMP Appropriate __YES      ___No

## 2022-07-02 ENCOUNTER — Inpatient Hospital Stay: Admit: 2022-07-02 | Payer: MEDICARE | Primary: Family Medicine

## 2022-07-02 DIAGNOSIS — Z13820 Encounter for screening for osteoporosis: Secondary | ICD-10-CM

## 2022-07-02 DIAGNOSIS — Z780 Asymptomatic menopausal state: Secondary | ICD-10-CM

## 2022-07-03 ENCOUNTER — Encounter: Payer: MEDICARE | Attending: Orthopaedic Surgery | Primary: Family Medicine

## 2022-07-08 ENCOUNTER — Ambulatory Visit: Payer: MEDICARE | Primary: Family Medicine

## 2022-07-17 ENCOUNTER — Ambulatory Visit: Admit: 2022-07-17 | Discharge: 2022-07-17 | Payer: MEDICARE | Attending: Orthopaedic Surgery | Primary: Family Medicine

## 2022-07-17 DIAGNOSIS — M1711 Unilateral primary osteoarthritis, right knee: Secondary | ICD-10-CM

## 2022-07-17 NOTE — Progress Notes (Signed)
[address]  [place], [place] [address]    [address]  [place], [place] [address]    Phone: [phone]  Fax:      [phone]  E-mail: [email or web address]       HPI  Cynthia Buck is a 67 y.o. female who presents for right knee pain.   Last seen 05/06/22 after a trip to [place], she had a cortisone injection on 04/10/22 with relief. She has done PT for this. She complains of persistent medial knee pain and swelling after activity like using the elliptical. She complains of a stretching and catching sensation on the knee. Pain when present is 5/10 and she uses Tylenol PRN pain.  She states that her right knee is feeling worse and the swelling comes and goes and the pain is on the medial side of the knee.  Her left hip is doing well.  She states that it is activity related with regards to her right knee and it can keep her up at night as well as give her rest pain.    Past Medical History:   Diagnosis Date    Dyslipidemia 06/18/2013    Eczema 01/07/2022    Hypercholesteremia 03/21/2022    Hypertension 06/18/2013    Irritable bowel syndrome 03/24/2020    Prediabetes 06/18/2013       History reviewed. No pertinent surgical history.    Social History     Occupational History    Not on file   Tobacco Use    Smoking status: Never    Smokeless tobacco: Never   Substance and Sexual Activity    Alcohol use: Not on file    Drug use: Not on file    Sexual activity: Not on file       Current Outpatient Medications   Medication Instructions    ALPRAZolam (XANAX) 0.25 mg, oral, As needed    ergocalciferol, vitamin D2, (Vitamin D-2) 1.25 MG (50000 UT) capsule 1 capsule, oral, Weekly    folic acid (FOLVITE) 1 mg, oral, Daily    lisinopril 10 mg, oral, Daily    meloxicam (MOBIC) 7.5 mg, oral, Daily PRN    meloxicam (MOBIC) 7.5 mg, oral, Daily    methotrexate 2.5 mg tablet SMARTSIG:6 By Mouth Once a Week    ondansetron ODT (ZOFRAN-ODT) 4 mg, oral, Every 8 hours PRN    SUMAtriptan (IMITREX) 100 mg, oral, Once as needed, May  repeat after 2 hours.    venlafaxine XR (EFFEXOR-XR) 37.5 mg, oral, Daily, Do not crush or chew.       Allergies   Allergen Reactions    Fluoxetine Diarrhea     Other reaction(s): Other (See Comments), Other (See Comments), Other (See Comments), Unknown  "Felt like losing my mind"  "Felt like losing my mind" "Felt like losing my mind"    "Felt like losing my mind"  "Felt like losing my mind"  "Felt like losing my mind"  "Felt like losing my mind"         Exam  GENERAL: well appearing, alert and oriented x3, appropriate mood/affect in no acute distress  Right Knee:   Skin intact.  PROM 0 - 130 degrees  Soft Tissue Swelling -   Effusion - mild  Pinch - pos  Patellar facet -   Patella grind -   MJLT - pos  LJLT -   McMurray - pos  Varus stress -  Valgus stress -  Lachman -  Posterior Drawer -  Dial Test -  Capillary refill less than 2 seconds  Sensation intact to LT over m/r/u distributions  Reexamined today    Diagnostic Tests  L Inj/Asp: L greater trochanteric bursa  Maple Hudson, MD     04/10/2022 11:58 PM  L Inj/Asp: L greater trochanteric bursa on 04/10/2022 11:57 PM  Indications: pain  Details: 22 G needle, ultrasound-guided lateral approach  Medications: 80 mg methylPREDNISolone acetate 40 mg/mL; 4 mL BUPivacaine   HCl 0.5 % (5 mg/mL)  Outcome: tolerated well, no immediate complications    Procedure, treatment alternatives, risks and benefits explained, specific   risks discussed. Consent was given by the patient. Immediately prior to   procedure a time out was called to verify the correct patient, procedure,   equipment, support staff and site/side marked as required. Patient was   prepped and draped in the usual sterile fashion.     Patient tolerance: tolerated well, no immediate complications  L Inj/Asp: R knee  Maple Hudson, MD     04/10/2022 11:58 PM  L Inj/Asp: R knee on 04/10/2022 11:57 PM  Indications: pain and joint swelling  Details: 22 G needle, ultrasound-guided superolateral approach  Medications: 80 mg  methylPREDNISolone acetate 40 mg/mL; 4 mL BUPivacaine   HCl 0.5 % (5 mg/mL)  Aspirate: 11 mL clear and yellow  Outcome: tolerated well, no immediate complications    Procedure, treatment alternatives, risks and benefits explained, specific   risks discussed. Consent was given by the patient. Immediately prior to   procedure a time out was called to verify the correct patient, procedure,   equipment, support staff and site/side marked as required. Patient was   prepped and draped in the usual sterile fashion.     Patient tolerance: tolerated well, no immediate complications  XR KNEE RIGHT 4+ VIEWS  Imaging Result: 04/10/2022    AP/lateral/sunrise/Rosenberg right knee demonstrates mild spurring with no   significant joint space narrowing and no fracture seen  Impression: Mild DJD, right knee     Assessment & Plan  This patient has signs and symptoms of a medial meniscal tear with the swelling and activity related medial sided knee pain.  I would recommend an MRI to evaluate for medial meniscal tear and I will see the patient back after the MRI to go over results and options    Primary osteoarthritis of right knee [M17.11]  Procedures

## 2022-07-17 NOTE — Telephone Encounter (Signed)
Last OV: 03/05/22  Type:  Last PE:03/05/22  Does patient have 6th month follow up:  (If not, do outreach)  Last Diabetic OV: 5.8  Does patient has 3 to 6 month diabetic follow up:  (If not, do outreach)  Last BP OV: 116/66  Does patient have 6 month follow up:   (If not, do outreach)  Last TOX screen:   Does patient have 3-6 month follow up:   (If not, do outreach)  Next OV: 03/11/23   Last refill 05/06/22 #10 x1 refill  Dr Lysle Morales out of office

## 2022-07-29 NOTE — Telephone Encounter (Signed)
Patient is calling to speak with Dr.Matthew about blood pressure medication.    Patient stats that she had stopped taking the medication due to dizziness and ever since she stopped she felt better.     She would like to know if Dr.Matthew will like to prescribed a different medication.     Patient is request a call back to further discuss.     Best Call Back: [phone]     Thank you so much.

## 2022-07-30 NOTE — Telephone Encounter (Signed)
Spoke with Cynthia Buck she feels better with out medication. She did take her blood pressure [facility] and it was normal.  I let her know that Dr Lysle Morales will be in the office on Wednesday and will follow up. Medication Lisinopril 10 mg

## 2022-07-31 NOTE — Telephone Encounter (Signed)
Spoke to [employer],   She has been taking lisinopril for years but also has had years of dizziness/lightheadedness.   She had seen this may be a s/e of Lisinopril and stopped the medication with resolution of her sx.   She has been checking her BP without the medication and it has been normal.   She is moving back to [place].     I don't believe we need to start a new medication as she has been well controlled at home/asx. She will establish care with new provider once she gets settled and follow up on BP at that time.

## 2022-08-06 ENCOUNTER — Ambulatory Visit: Admit: 2022-08-06 | Discharge: 2022-08-06 | Payer: MEDICARE | Attending: Orthopaedic Surgery | Primary: Family Medicine

## 2022-08-06 DIAGNOSIS — M1711 Unilateral primary osteoarthritis, right knee: Secondary | ICD-10-CM

## 2022-08-06 NOTE — Progress Notes (Signed)
[address]  [place], [place] [address]    [address]  [place], [place] [address]    Phone: [phone]  Fax:      [phone]  E-mail: [email or web address]       HPI  Jamyah is a 67 y.o. female who presents for right knee pain.   Last seen  07/17/22, she had a cortisone injection on 04/10/22 with relief and has an MRI for review. She is still having swelling and pain with activity.  She states that over the last several days it is a little bit better and that the worse activity is stairs and it is mainly over the anterior medial aspect of the right knee where she will feel the discomfort.    Past Medical History:   Diagnosis Date    Dyslipidemia (CMS-HCC) 06/18/2013    Eczema 01/07/2022    Hypercholesteremia (CMS-HCC) 03/21/2022    Hypertension (CMS-HCC) 06/18/2013    Irritable bowel syndrome 03/24/2020    Prediabetes 06/18/2013       History reviewed. No pertinent surgical history.    Social History     Occupational History    Not on file   Tobacco Use    Smoking status: Never    Smokeless tobacco: Never   Substance and Sexual Activity    Alcohol use: Not on file    Drug use: Not on file    Sexual activity: Not on file       Current Outpatient Medications   Medication Instructions    ALPRAZolam (XANAX) 0.25 mg, oral, As needed    ergocalciferol, vitamin D2, (Vitamin D-2) 1.25 MG (50000 UT) capsule 1 capsule, oral, Weekly    folic acid (FOLVITE) 1 mg, oral, Daily    lisinopril 10 mg, oral, Daily    meloxicam (MOBIC) 7.5 mg, oral, Daily PRN    meloxicam (MOBIC) 7.5 mg, oral, Daily    methotrexate 2.5 mg tablet SMARTSIG:6 By Mouth Once a Week    ondansetron ODT (ZOFRAN-ODT) 4 mg, oral, Every 8 hours PRN    SUMAtriptan (IMITREX) 100 mg, oral, Once as needed, May repeat after 2 hours.    venlafaxine XR (EFFEXOR-XR) 37.5 mg, oral, Daily, Do not crush or chew.       Allergies   Allergen Reactions    Fluoxetine Diarrhea     Other reaction(s): Other (See Comments), Other (See Comments), Other (See  Comments), Unknown  "Felt like losing my mind"  "Felt like losing my mind" "Felt like losing my mind"    "Felt like losing my mind"  "Felt like losing my mind"  "Felt like losing my mind"  "Felt like losing my mind"         Exam  GENERAL: well appearing, alert and oriented x3, appropriate mood/affect in no acute distress  Right Knee:   Skin intact.  PROM 0 - 130 degrees  Soft Tissue Swelling -   Effusion - mild  Pinch - pos  Patellar facet -   Patella grind -   MJLT - pos  LJLT -   McMurray - pos  Varus stress -  Valgus stress -  Lachman -  Posterior Drawer -  Dial Test -  Capillary refill less than 2 seconds  Sensation intact to LT over m/r/u distributions  Reexamined today    Diagnostic Tests  L Inj/Asp: L greater trochanteric bursa  Maple Hudson, MD     04/10/2022 11:58 PM  L Inj/Asp: L greater trochanteric bursa on 04/10/2022  11:57 PM  Indications: pain  Details: 22 G needle, ultrasound-guided lateral approach  Medications: 80 mg methylPREDNISolone acetate 40 mg/mL; 4 mL BUPivacaine   HCl 0.5 % (5 mg/mL)  Outcome: tolerated well, no immediate complications    Procedure, treatment alternatives, risks and benefits explained, specific   risks discussed. Consent was given by the patient. Immediately prior to   procedure a time out was called to verify the correct patient, procedure,   equipment, support staff and site/side marked as required. Patient was   prepped and draped in the usual sterile fashion.     Patient tolerance: tolerated well, no immediate complications  L Inj/Asp: R knee  Maple Hudson, MD     04/10/2022 11:58 PM  L Inj/Asp: R knee on 04/10/2022 11:57 PM  Indications: pain and joint swelling  Details: 22 G needle, ultrasound-guided superolateral approach  Medications: 80 mg methylPREDNISolone acetate 40 mg/mL; 4 mL BUPivacaine   HCl 0.5 % (5 mg/mL)  Aspirate: 11 mL clear and yellow  Outcome: tolerated well, no immediate complications    Procedure, treatment alternatives, risks and benefits explained, specific    risks discussed. Consent was given by the patient. Immediately prior to   procedure a time out was called to verify the correct patient, procedure,   equipment, support staff and site/side marked as required. Patient was   prepped and draped in the usual sterile fashion.     Patient tolerance: tolerated well, no immediate complications  XR KNEE RIGHT 4+ VIEWS  Imaging Result: 04/10/2022    AP/lateral/sunrise/Rosenberg right knee demonstrates mild spurring with no   significant joint space narrowing and no fracture seen  Impression: Mild DJD, right knee     === 07/29/22 ===  [facility]  MRI KNEE RIGHT  WO CONTRAST  Images and report reviewed and interpreted by me.   - Impression -  1. Tricompartment thinning of the articular cartilage of the right  knee most notable along the medial patella facet and medial femoral  condyle.  There is minor subchondral bone marrow edema of the  posteromedial medial femoral condyle.  A small to moderate-sized  joint effusion is present, with no intra-articular loose fragments  seen.  2. Minor intrasubstance degenerative signal within the menisci but no  discrete linear surfacing medial or lateral meniscal tear of the  right knee.  3. The supporting ligaments and tendons of the right knee are intact  without acute or chronic injury identified.    Assessment & Plan  This patient has likely DJD flareup of the right knee.  I do not see any areas of meniscal pathology although I did discuss with her that the meniscal capsular junction on one of the sagittal cuts could suggest a meniscocapsular tear however this is not obvious or definitive.  As she is starting to feel little bit better we will watch her symptoms and she states that she will move down to [place] in the coming month and that I have encouraged her to get a copy of the CD that she can bring with her down self should she need follow-up with an orthopedic surgeon there..  We discussed that she can be activities as  tolerated and I will follow-up with her on an as-needed basis.    Primary osteoarthritis of right knee [M17.11]  Procedures

## 2022-09-23 NOTE — Telephone Encounter (Addendum)
Is the patient due for a refill?     How many refills are left on current Rx?    Last OV with PCP: 03/05/22    Next OV with PCP: 03/11/23    Last PE:     Last Diabetic OV:     Last HGBA1C:   HgbA1C (%)   Date Value   03/20/2022 5.8 (H)       Last BP:   BP Readings from Last 3 Encounters:   03/05/22 116/66       TSH: No results found for: "TSH"    Last TOX screen:     Labs    Lab Results   Component Value Date    CALCIUM 9.5 03/20/2022    ALBUMIN 4.6 03/20/2022    NA 139 03/20/2022    K 4.5 03/20/2022    CO2 21 03/20/2022    CL 102 03/20/2022    BUN 12 03/20/2022    CREATININE 0.60 03/20/2022      AST   Date Value Ref Range Status   03/20/2022 35 0 - 40 IU/L Final     ALT   Date Value Ref Range Status   03/20/2022 39 (H) 0 - 32 IU/L Final     No components found for: "TBILI"  No results found for: "HGB"  No results found for: "HCT"  No results found for: "PLT"  No results found for: "PTT"  No results found for: "INR"  No results found for: "PROTIME"  No results found for: "PTADJUSTED"

## 2022-11-20 NOTE — Telephone Encounter (Signed)
Last OV with PCP: 03/05/2022  09/22/22 sam  Next OV with PCP: Visit date not found  Last refill  Last HGBA1C:   HgbA1C (%)   Date Value   03/20/2022 5.8 (H)       Last BP:   BP Readings from Last 3 Encounters:   03/05/22 116/66       TSH: No results found for: "TSH"    Last TOX screen (if applicable):    Labs    Lab Results   Component Value Date    CALCIUM 9.5 03/20/2022    ALBUMIN 4.6 03/20/2022    NA 139 03/20/2022    K 4.5 03/20/2022    CO2 21 03/20/2022    CL 102 03/20/2022    BUN 12 03/20/2022    CREATININE 0.60 03/20/2022      AST   Date Value Ref Range Status   03/20/2022 35 0 - 40 IU/L Final     ALT   Date Value Ref Range Status   03/20/2022 39 (H) 0 - 32 IU/L Final     No components found for: "TBILI"  No results found for: "HGB"  No results found for: "HCT"  No results found for: "PLT"  No results found for: "PTT"  No results found for: "INR"  No results found for: "PROTIME"  No results found for: "PTADJUSTED"

## 2023-03-11 ENCOUNTER — Encounter: Payer: MEDICARE | Attending: Family Medicine | Primary: Family Medicine
